# Patient Record
Sex: Male | Born: 1940 | Race: Black or African American | Hispanic: No | Marital: Married | Smoking: Former smoker
Health system: Southern US, Community
[De-identification: ages and names within clinical notes are randomized; demographics above are authoritative.]

## PROBLEM LIST (undated history)

## (undated) DIAGNOSIS — E669 Obesity, unspecified: Secondary | ICD-10-CM

## (undated) DIAGNOSIS — E119 Type 2 diabetes mellitus without complications: Secondary | ICD-10-CM

## (undated) DIAGNOSIS — I10 Essential (primary) hypertension: Secondary | ICD-10-CM

## (undated) DIAGNOSIS — T884XXA Failed or difficult intubation, initial encounter: Secondary | ICD-10-CM

## (undated) DIAGNOSIS — G4730 Sleep apnea, unspecified: Secondary | ICD-10-CM

## (undated) DIAGNOSIS — E7800 Pure hypercholesterolemia, unspecified: Secondary | ICD-10-CM

## (undated) HISTORY — PX: REPLACEMENT TOTAL KNEE BILATERAL: SUR1225

---

## 1998-11-10 ENCOUNTER — Ambulatory Visit (HOSPITAL_COMMUNITY): Admission: RE | Admit: 1998-11-10 | Discharge: 1998-11-10 | Payer: Self-pay | Admitting: Orthopedic Surgery

## 1998-11-10 ENCOUNTER — Encounter: Payer: Self-pay | Admitting: Orthopedic Surgery

## 1998-11-23 ENCOUNTER — Encounter: Payer: Self-pay | Admitting: Orthopedic Surgery

## 1998-11-24 ENCOUNTER — Ambulatory Visit (HOSPITAL_COMMUNITY): Admission: RE | Admit: 1998-11-24 | Discharge: 1998-11-24 | Payer: Self-pay | Admitting: Orthopedic Surgery

## 1999-05-15 ENCOUNTER — Encounter: Payer: Self-pay | Admitting: Cardiology

## 1999-05-15 ENCOUNTER — Ambulatory Visit (HOSPITAL_COMMUNITY): Admission: RE | Admit: 1999-05-15 | Discharge: 1999-05-15 | Payer: Self-pay | Admitting: Cardiology

## 1999-05-16 ENCOUNTER — Encounter: Payer: Self-pay | Admitting: Cardiology

## 1999-06-01 ENCOUNTER — Ambulatory Visit (HOSPITAL_COMMUNITY): Admission: RE | Admit: 1999-06-01 | Discharge: 1999-06-01 | Payer: Self-pay | Admitting: Cardiology

## 1999-06-01 ENCOUNTER — Encounter: Payer: Self-pay | Admitting: Cardiology

## 2000-06-17 ENCOUNTER — Encounter: Payer: Self-pay | Admitting: Cardiology

## 2000-06-17 ENCOUNTER — Encounter: Admission: RE | Admit: 2000-06-17 | Discharge: 2000-06-17 | Payer: Self-pay | Admitting: Cardiology

## 2000-09-04 ENCOUNTER — Ambulatory Visit: Admission: RE | Admit: 2000-09-04 | Discharge: 2000-09-04 | Payer: Self-pay | Admitting: Pulmonary Disease

## 2000-10-21 ENCOUNTER — Ambulatory Visit (HOSPITAL_COMMUNITY): Admission: RE | Admit: 2000-10-21 | Discharge: 2000-10-21 | Payer: Self-pay | Admitting: Cardiology

## 2000-10-22 ENCOUNTER — Ambulatory Visit (HOSPITAL_COMMUNITY): Admission: RE | Admit: 2000-10-22 | Discharge: 2000-10-22 | Payer: Self-pay | Admitting: Cardiology

## 2000-10-22 ENCOUNTER — Encounter: Payer: Self-pay | Admitting: Cardiology

## 2000-10-30 ENCOUNTER — Ambulatory Visit (HOSPITAL_COMMUNITY): Admission: RE | Admit: 2000-10-30 | Discharge: 2000-10-30 | Payer: Self-pay | Admitting: Cardiology

## 2000-10-31 ENCOUNTER — Encounter: Payer: Self-pay | Admitting: Cardiology

## 2001-05-12 ENCOUNTER — Ambulatory Visit (HOSPITAL_COMMUNITY): Admission: RE | Admit: 2001-05-12 | Discharge: 2001-05-12 | Payer: Self-pay | Admitting: Cardiology

## 2001-05-12 ENCOUNTER — Encounter: Payer: Self-pay | Admitting: Cardiology

## 2001-11-03 ENCOUNTER — Encounter: Admission: RE | Admit: 2001-11-03 | Discharge: 2002-02-01 | Payer: Self-pay | Admitting: *Deleted

## 2002-02-02 ENCOUNTER — Encounter: Admission: RE | Admit: 2002-02-02 | Discharge: 2002-02-10 | Payer: Self-pay | Admitting: *Deleted

## 2002-05-19 ENCOUNTER — Encounter: Admission: RE | Admit: 2002-05-19 | Discharge: 2002-05-19 | Payer: Self-pay | Admitting: Cardiology

## 2002-05-19 ENCOUNTER — Encounter: Payer: Self-pay | Admitting: Cardiology

## 2002-11-22 ENCOUNTER — Encounter: Admission: RE | Admit: 2002-11-22 | Discharge: 2003-02-20 | Payer: Self-pay | Admitting: Cardiology

## 2004-10-10 ENCOUNTER — Encounter: Admission: RE | Admit: 2004-10-10 | Discharge: 2004-10-10 | Payer: Self-pay | Admitting: Cardiology

## 2006-07-23 ENCOUNTER — Encounter: Admission: RE | Admit: 2006-07-23 | Discharge: 2006-07-23 | Payer: Self-pay | Admitting: Cardiology

## 2014-07-06 ENCOUNTER — Encounter (HOSPITAL_COMMUNITY): Payer: Self-pay | Admitting: *Deleted

## 2014-07-06 ENCOUNTER — Emergency Department (HOSPITAL_COMMUNITY)
Admission: EM | Admit: 2014-07-06 | Discharge: 2014-07-06 | Disposition: A | Discharge status: Home / Self Care | Payer: 59 | Attending: Emergency Medicine | Admitting: Emergency Medicine

## 2014-07-06 DIAGNOSIS — I10 Essential (primary) hypertension: Secondary | ICD-10-CM | POA: Diagnosis not present

## 2014-07-06 DIAGNOSIS — E669 Obesity, unspecified: Secondary | ICD-10-CM | POA: Diagnosis not present

## 2014-07-06 DIAGNOSIS — M545 Low back pain, unspecified: Secondary | ICD-10-CM

## 2014-07-06 DIAGNOSIS — Z87891 Personal history of nicotine dependence: Secondary | ICD-10-CM | POA: Diagnosis not present

## 2014-07-06 DIAGNOSIS — E119 Type 2 diabetes mellitus without complications: Secondary | ICD-10-CM | POA: Insufficient documentation

## 2014-07-06 DIAGNOSIS — M25551 Pain in right hip: Secondary | ICD-10-CM | POA: Diagnosis present

## 2014-07-06 HISTORY — DX: Pure hypercholesterolemia, unspecified: E78.00

## 2014-07-06 HISTORY — DX: Obesity, unspecified: E66.9

## 2014-07-06 HISTORY — DX: Essential (primary) hypertension: I10

## 2014-07-06 HISTORY — DX: Type 2 diabetes mellitus without complications: E11.9

## 2014-07-06 MED ORDER — HYDROCODONE-ACETAMINOPHEN 5-325 MG PO TABS: 1.0000 | ORAL_TABLET | Freq: Four times a day (QID) | ORAL | Status: DC | PRN

## 2014-07-06 MED ORDER — HYDROCODONE-ACETAMINOPHEN 5-325 MG PO TABS
1.0000 | ORAL_TABLET | Freq: Once | ORAL | Status: AC
  Administered 2014-07-06: 1 via ORAL
  Filled 2014-07-06: qty 1

## 2014-07-06 MED ORDER — HYDROCODONE-ACETAMINOPHEN 5-325 MG PO TABS: 1.0000 | ORAL_TABLET | ORAL | Status: DC | PRN

## 2014-07-06 NOTE — ED Provider Notes (Signed)
CSN: [id number]     Arrival date & time 07/06/14  [id number] History   First MD Initiated Contact with Patient 07/06/14 [phone]     Chief Complaint  Patient presents with  . Hip Pain     (Consider location/radiation/quality/duration/timing/severity/associated sxs/prior Treatment) HPI Complains of right hip pain (points to right sacroiliac joint), gradual onset 2 nights ago after riding on a lawnmower. No trauma pain worse with movement improved with remaining still he took a pain pill given to him by his wife(he does not recall what the pill was), with some improvement of discomfort. Pain is nonradiating, moderate to severe. No trauma no fever no other associated symptoms Past Medical History  Diagnosis Date  . Hypertension   . High cholesterol   . Diabetes mellitus without complication   . Obesity    History reviewed. No pertinent past surgical history. bilateral knee surgeries History reviewed. No pertinent family history. History  Substance Use Topics  . Smoking status: Former Games developermoker  . Smokeless tobacco: Not on file  . Alcohol Use: Yes     Comment: occ    Review of Systems  Musculoskeletal: Positive for arthralgias.  All other systems reviewed and are negative.     Allergies  Review of patient's allergies indicates no known allergies.  Home Medications   Prior to Admission medications   Not on File   BP 126/60 mmHg  Pulse 82  Temp(Src) 99.1 F (37.3 C) (Oral)  Resp 20  Ht 5\' 11"  (1.803 m)  Wt 278 lb (126.1 kg)  BMI 38.79 kg/m2  SpO2 97% Physical Exam  Constitutional: He appears well-developed and well-nourished. He appears distressed.  Appears mildly uncomfortable, nontoxic alert  HENT:  Head: Normocephalic and atraumatic.  Eyes: Conjunctivae are normal. Pupils are equal, round, and reactive to light.  Neck: Neck supple. No tracheal deviation present. No thyromegaly present.  Cardiovascular: Normal rate and regular rhythm.   No murmur heard. Pulmonary/Chest:  Effort normal and breath sounds normal.  Abdominal: Soft. Bowel sounds are normal. He exhibits no distension. There is no tenderness.  Morbidly obese  Musculoskeletal: Normal range of motion. He exhibits no edema or tenderness.  Entire spine nontender pelvis stable nontender. Has pain at right sacroiliac joint when stands up to walk. Walks with minimal limp favoring right leg  Neurological: He is alert. Coordination normal.  Skin: Skin is warm and dry. No rash noted.  Psychiatric: He has a normal mood and affect.  Nursing note and vitals reviewed.   ED Course  Procedures (including critical care time) Labs Review Labs Reviewed - No data to display  Imaging Review No results found.   EKG Interpretation None      MDM  I suspect the patient has muscle or ligamentous strain. Sacroiliitis also a possibility There was no trauma. He does not feel ill. Imaging not indicated acutely. Patient agrees. Plan prescription Norco.. Tylenol for mild pain We will not give as pt patient diabetic Follow up with PMD, urgent care or return if not better in 3 or 4 days Diagnosis: Low back pain Final diagnoses:  None        Doug SouSam Fue Cervenka, MD 07/06/14 [phone]

## 2014-07-06 NOTE — Discharge Instructions (Signed)
Take Tylenol for mild pain or the pain medicine prescribed for bad pain. Don't drink any alcohol with the medicine prescribed as the combination is dangerous. Go to an urgent care center, [facility] doctor at the [facility], or turn if not better in 3 or 4 days.

## 2014-07-06 NOTE — ED Notes (Signed)
Pt reports right hip pain since mowing the grass a few days ago.

## 2016-05-24 ENCOUNTER — Emergency Department (HOSPITAL_COMMUNITY)
Admission: EM | Admit: 2016-05-24 | Discharge: 2016-05-24 | Disposition: A | Discharge status: Home / Self Care | Payer: No Typology Code available for payment source | Attending: Emergency Medicine | Admitting: Emergency Medicine

## 2016-05-24 ENCOUNTER — Encounter (HOSPITAL_COMMUNITY): Payer: Self-pay

## 2016-05-24 DIAGNOSIS — Y939 Activity, unspecified: Secondary | ICD-10-CM | POA: Insufficient documentation

## 2016-05-24 DIAGNOSIS — Y92410 Unspecified street and highway as the place of occurrence of the external cause: Secondary | ICD-10-CM | POA: Insufficient documentation

## 2016-05-24 DIAGNOSIS — I10 Essential (primary) hypertension: Secondary | ICD-10-CM | POA: Diagnosis not present

## 2016-05-24 DIAGNOSIS — Y999 Unspecified external cause status: Secondary | ICD-10-CM | POA: Insufficient documentation

## 2016-05-24 DIAGNOSIS — S4992XA Unspecified injury of left shoulder and upper arm, initial encounter: Secondary | ICD-10-CM | POA: Diagnosis present

## 2016-05-24 DIAGNOSIS — Z043 Encounter for examination and observation following other accident: Secondary | ICD-10-CM

## 2016-05-24 DIAGNOSIS — E119 Type 2 diabetes mellitus without complications: Secondary | ICD-10-CM | POA: Diagnosis not present

## 2016-05-24 DIAGNOSIS — Z87891 Personal history of nicotine dependence: Secondary | ICD-10-CM | POA: Diagnosis not present

## 2016-05-24 DIAGNOSIS — Z041 Encounter for examination and observation following transport accident: Secondary | ICD-10-CM

## 2016-05-24 DIAGNOSIS — S46912A Strain of unspecified muscle, fascia and tendon at shoulder and upper arm level, left arm, initial encounter: Secondary | ICD-10-CM | POA: Insufficient documentation

## 2016-05-24 MED ORDER — LIDOCAINE 5 % EX PTCH: 1.0000 | MEDICATED_PATCH | CUTANEOUS | 0 refills | Status: DC

## 2016-05-24 NOTE — Discharge Instructions (Signed)
Contact a health care provider if: °You have increasing pain or swelling in the injured area. °You have numbness, tingling, or a significant loss of strength in the injured area °
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
## 2016-05-24 NOTE — ED Notes (Signed)
Pt comfortable with discharge and follow up instructions. Pt declines wheelchair, escorted to waiting area by this RN. Rx x1 
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
## 2018-06-29 ENCOUNTER — Other Ambulatory Visit: Payer: Self-pay

## 2018-06-29 ENCOUNTER — Emergency Department (HOSPITAL_COMMUNITY)
Admission: EM | Admit: 2018-06-29 | Discharge: 2018-06-30 | Disposition: A | Discharge status: Home / Self Care | Payer: Medicare Other | Source: Home / Self Care | Attending: Emergency Medicine | Admitting: Emergency Medicine

## 2018-06-29 DIAGNOSIS — R7881 Bacteremia: Secondary | ICD-10-CM | POA: Diagnosis not present

## 2018-06-29 DIAGNOSIS — Z87891 Personal history of nicotine dependence: Secondary | ICD-10-CM | POA: Insufficient documentation

## 2018-06-29 DIAGNOSIS — I129 Hypertensive chronic kidney disease with stage 1 through stage 4 chronic kidney disease, or unspecified chronic kidney disease: Secondary | ICD-10-CM | POA: Diagnosis present

## 2018-06-29 DIAGNOSIS — J101 Influenza due to other identified influenza virus with other respiratory manifestations: Secondary | ICD-10-CM

## 2018-06-29 DIAGNOSIS — N183 Chronic kidney disease, stage 3 (moderate): Secondary | ICD-10-CM | POA: Diagnosis present

## 2018-06-29 DIAGNOSIS — J111 Influenza due to unidentified influenza virus with other respiratory manifestations: Secondary | ICD-10-CM

## 2018-06-29 DIAGNOSIS — Z6841 Body Mass Index (BMI) 40.0 and over, adult: Secondary | ICD-10-CM

## 2018-06-29 DIAGNOSIS — E7800 Pure hypercholesterolemia, unspecified: Secondary | ICD-10-CM | POA: Diagnosis present

## 2018-06-29 DIAGNOSIS — Z79899 Other long term (current) drug therapy: Secondary | ICD-10-CM

## 2018-06-29 DIAGNOSIS — R69 Illness, unspecified: Principal | ICD-10-CM

## 2018-06-29 DIAGNOSIS — R109 Unspecified abdominal pain: Secondary | ICD-10-CM | POA: Insufficient documentation

## 2018-06-29 DIAGNOSIS — E119 Type 2 diabetes mellitus without complications: Secondary | ICD-10-CM

## 2018-06-29 DIAGNOSIS — Z79891 Long term (current) use of opiate analgesic: Secondary | ICD-10-CM

## 2018-06-29 DIAGNOSIS — E860 Dehydration: Secondary | ICD-10-CM | POA: Diagnosis present

## 2018-06-29 DIAGNOSIS — J209 Acute bronchitis, unspecified: Secondary | ICD-10-CM | POA: Diagnosis present

## 2018-06-29 DIAGNOSIS — A409 Streptococcal sepsis, unspecified: Principal | ICD-10-CM | POA: Diagnosis present

## 2018-06-29 DIAGNOSIS — Z96653 Presence of artificial knee joint, bilateral: Secondary | ICD-10-CM | POA: Diagnosis present

## 2018-06-29 DIAGNOSIS — E785 Hyperlipidemia, unspecified: Secondary | ICD-10-CM | POA: Diagnosis present

## 2018-06-29 DIAGNOSIS — I10 Essential (primary) hypertension: Secondary | ICD-10-CM | POA: Insufficient documentation

## 2018-06-29 DIAGNOSIS — D631 Anemia in chronic kidney disease: Secondary | ICD-10-CM | POA: Diagnosis present

## 2018-06-29 DIAGNOSIS — E1122 Type 2 diabetes mellitus with diabetic chronic kidney disease: Secondary | ICD-10-CM | POA: Diagnosis present

## 2018-06-29 DIAGNOSIS — N179 Acute kidney failure, unspecified: Secondary | ICD-10-CM | POA: Diagnosis present

## 2018-06-29 DIAGNOSIS — E669 Obesity, unspecified: Secondary | ICD-10-CM | POA: Diagnosis present

## 2018-06-30 ENCOUNTER — Emergency Department (HOSPITAL_COMMUNITY): Payer: Medicare Other

## 2018-06-30 ENCOUNTER — Encounter (HOSPITAL_COMMUNITY): Payer: Self-pay | Admitting: *Deleted

## 2018-06-30 ENCOUNTER — Other Ambulatory Visit: Payer: Self-pay

## 2018-06-30 ENCOUNTER — Encounter (HOSPITAL_COMMUNITY): Payer: Self-pay | Admitting: Emergency Medicine

## 2018-06-30 ENCOUNTER — Inpatient Hospital Stay (HOSPITAL_COMMUNITY)
Admission: EM | Admit: 2018-06-30 | Discharge: 2018-07-02 | DRG: 872 | Disposition: A | Discharge status: Home / Self Care | Payer: Medicare Other | Attending: Internal Medicine | Admitting: Internal Medicine

## 2018-06-30 DIAGNOSIS — E7800 Pure hypercholesterolemia, unspecified: Secondary | ICD-10-CM | POA: Diagnosis present

## 2018-06-30 DIAGNOSIS — N183 Chronic kidney disease, stage 3 unspecified: Secondary | ICD-10-CM | POA: Diagnosis present

## 2018-06-30 DIAGNOSIS — I10 Essential (primary) hypertension: Secondary | ICD-10-CM | POA: Diagnosis present

## 2018-06-30 DIAGNOSIS — Z79891 Long term (current) use of opiate analgesic: Secondary | ICD-10-CM | POA: Diagnosis not present

## 2018-06-30 DIAGNOSIS — E1129 Type 2 diabetes mellitus with other diabetic kidney complication: Secondary | ICD-10-CM | POA: Diagnosis present

## 2018-06-30 DIAGNOSIS — B955 Unspecified streptococcus as the cause of diseases classified elsewhere: Secondary | ICD-10-CM | POA: Diagnosis not present

## 2018-06-30 DIAGNOSIS — N179 Acute kidney failure, unspecified: Secondary | ICD-10-CM | POA: Diagnosis present

## 2018-06-30 DIAGNOSIS — R7881 Bacteremia: Secondary | ICD-10-CM | POA: Diagnosis present

## 2018-06-30 DIAGNOSIS — Z6841 Body Mass Index (BMI) 40.0 and over, adult: Secondary | ICD-10-CM | POA: Diagnosis not present

## 2018-06-30 DIAGNOSIS — E1122 Type 2 diabetes mellitus with diabetic chronic kidney disease: Secondary | ICD-10-CM

## 2018-06-30 DIAGNOSIS — Z79899 Other long term (current) drug therapy: Secondary | ICD-10-CM | POA: Diagnosis not present

## 2018-06-30 DIAGNOSIS — Z87891 Personal history of nicotine dependence: Secondary | ICD-10-CM | POA: Diagnosis not present

## 2018-06-30 DIAGNOSIS — J209 Acute bronchitis, unspecified: Secondary | ICD-10-CM | POA: Diagnosis present

## 2018-06-30 DIAGNOSIS — E669 Obesity, unspecified: Secondary | ICD-10-CM | POA: Diagnosis present

## 2018-06-30 DIAGNOSIS — I129 Hypertensive chronic kidney disease with stage 1 through stage 4 chronic kidney disease, or unspecified chronic kidney disease: Secondary | ICD-10-CM | POA: Diagnosis present

## 2018-06-30 DIAGNOSIS — A409 Streptococcal sepsis, unspecified: Secondary | ICD-10-CM | POA: Diagnosis present

## 2018-06-30 DIAGNOSIS — A419 Sepsis, unspecified organism: Secondary | ICD-10-CM | POA: Diagnosis present

## 2018-06-30 DIAGNOSIS — D631 Anemia in chronic kidney disease: Secondary | ICD-10-CM | POA: Diagnosis present

## 2018-06-30 DIAGNOSIS — N189 Chronic kidney disease, unspecified: Secondary | ICD-10-CM | POA: Diagnosis present

## 2018-06-30 DIAGNOSIS — E119 Type 2 diabetes mellitus without complications: Secondary | ICD-10-CM | POA: Diagnosis not present

## 2018-06-30 DIAGNOSIS — Z96653 Presence of artificial knee joint, bilateral: Secondary | ICD-10-CM | POA: Diagnosis present

## 2018-06-30 DIAGNOSIS — E785 Hyperlipidemia, unspecified: Secondary | ICD-10-CM | POA: Diagnosis present

## 2018-06-30 DIAGNOSIS — E860 Dehydration: Secondary | ICD-10-CM | POA: Diagnosis present

## 2018-06-30 LAB — RESPIRATORY PANEL BY PCR
Adenovirus: NOT DETECTED
Bordetella pertussis: NOT DETECTED
CHLAMYDOPHILA PNEUMONIAE-RVPPCR: NOT DETECTED
Coronavirus 229E: NOT DETECTED
Coronavirus HKU1: NOT DETECTED
Coronavirus NL63: NOT DETECTED
Coronavirus OC43: NOT DETECTED
INFLUENZA A-RVPPCR: NOT DETECTED
Influenza B: NOT DETECTED
Metapneumovirus: NOT DETECTED
Mycoplasma pneumoniae: NOT DETECTED
Parainfluenza Virus 1: NOT DETECTED
Parainfluenza Virus 2: NOT DETECTED
Parainfluenza Virus 3: NOT DETECTED
Parainfluenza Virus 4: NOT DETECTED
Respiratory Syncytial Virus: NOT DETECTED
Rhinovirus / Enterovirus: NOT DETECTED

## 2018-06-30 LAB — CBC WITH DIFFERENTIAL/PLATELET
Abs Immature Granulocytes: 0.12 10*3/uL — ABNORMAL HIGH (ref 0.00–0.07)
Abs Immature Granulocytes: 0.46 10*3/uL — ABNORMAL HIGH (ref 0.00–0.07)
Basophils Absolute: 0.1 10*3/uL (ref 0.0–0.1)
Basophils Absolute: 0.1 10*3/uL (ref 0.0–0.1)
Basophils Relative: 0 %
Basophils Relative: 0 %
Eosinophils Absolute: 0 10*3/uL (ref 0.0–0.5)
Eosinophils Absolute: 0 10*3/uL (ref 0.0–0.5)
Eosinophils Relative: 0 %
Eosinophils Relative: 0 %
HCT: 37.1 % — ABNORMAL LOW (ref 39.0–52.0)
HEMATOCRIT: 33.6 % — AB (ref 39.0–52.0)
Hemoglobin: 11.4 g/dL — ABNORMAL LOW (ref 13.0–17.0)
Hemoglobin: 12.1 g/dL — ABNORMAL LOW (ref 13.0–17.0)
Immature Granulocytes: 1 %
Immature Granulocytes: 2 %
Lymphocytes Relative: 12 %
Lymphocytes Relative: 14 %
Lymphs Abs: 1.9 10*3/uL (ref 0.7–4.0)
Lymphs Abs: 2.9 10*3/uL (ref 0.7–4.0)
MCH: 27.6 pg (ref 26.0–34.0)
MCH: 28.9 pg (ref 26.0–34.0)
MCHC: 32.6 g/dL (ref 30.0–36.0)
MCHC: 33.9 g/dL (ref 30.0–36.0)
MCV: 84.7 fL (ref 80.0–100.0)
MCV: 85.1 fL (ref 80.0–100.0)
MONO ABS: 2.6 10*3/uL — AB (ref 0.1–1.0)
Monocytes Absolute: 1.1 10*3/uL — ABNORMAL HIGH (ref 0.1–1.0)
Monocytes Relative: 11 %
Monocytes Relative: 7 %
Neutro Abs: 11 10*3/uL — ABNORMAL HIGH (ref 1.7–7.7)
Neutro Abs: 17.1 10*3/uL — ABNORMAL HIGH (ref 1.7–7.7)
Neutrophils Relative %: 75 %
Neutrophils Relative %: 78 %
Platelets: 214 10*3/uL (ref 150–400)
Platelets: 217 10*3/uL (ref 150–400)
RBC: 3.95 MIL/uL — ABNORMAL LOW (ref 4.22–5.81)
RBC: 4.38 MIL/uL (ref 4.22–5.81)
RDW: 13.3 % (ref 11.5–15.5)
RDW: 13.7 % (ref 11.5–15.5)
WBC: 14.1 10*3/uL — AB (ref 4.0–10.5)
WBC: 23.1 10*3/uL — ABNORMAL HIGH (ref 4.0–10.5)
nRBC: 0 % (ref 0.0–0.2)
nRBC: 0 % (ref 0.0–0.2)

## 2018-06-30 LAB — BLOOD CULTURE ID PANEL (REFLEXED)
Acinetobacter baumannii: NOT DETECTED
Candida albicans: NOT DETECTED
Candida glabrata: NOT DETECTED
Candida krusei: NOT DETECTED
Candida parapsilosis: NOT DETECTED
Candida tropicalis: NOT DETECTED
ENTEROBACTERIACEAE SPECIES: NOT DETECTED
Enterobacter cloacae complex: NOT DETECTED
Enterococcus species: NOT DETECTED
Escherichia coli: NOT DETECTED
HAEMOPHILUS INFLUENZAE: NOT DETECTED
Klebsiella oxytoca: NOT DETECTED
Klebsiella pneumoniae: NOT DETECTED
Listeria monocytogenes: NOT DETECTED
Neisseria meningitidis: NOT DETECTED
PSEUDOMONAS AERUGINOSA: NOT DETECTED
Proteus species: NOT DETECTED
STREPTOCOCCUS AGALACTIAE: NOT DETECTED
STREPTOCOCCUS SPECIES: DETECTED — AB
Serratia marcescens: NOT DETECTED
Staphylococcus aureus (BCID): NOT DETECTED
Staphylococcus species: NOT DETECTED
Streptococcus pneumoniae: NOT DETECTED
Streptococcus pyogenes: DETECTED — AB

## 2018-06-30 LAB — COMPREHENSIVE METABOLIC PANEL
ALT: 42 U/L (ref 0–44)
ALT: 33 U/L (ref 0–44)
ANION GAP: 10 (ref 5–15)
AST: 42 U/L — ABNORMAL HIGH (ref 15–41)
AST: 31 U/L (ref 15–41)
Albumin: 3.5 g/dL (ref 3.5–5.0)
Albumin: 2.9 g/dL — ABNORMAL LOW (ref 3.5–5.0)
Alkaline Phosphatase: 48 U/L (ref 38–126)
Alkaline Phosphatase: 46 U/L (ref 38–126)
Anion gap: 10 (ref 5–15)
BUN: 16 mg/dL (ref 8–23)
BUN: 15 mg/dL (ref 8–23)
CALCIUM: 8.4 mg/dL — AB (ref 8.9–10.3)
CO2: 24 mmol/L (ref 22–32)
CO2: 22 mmol/L (ref 22–32)
Calcium: 9.2 mg/dL (ref 8.9–10.3)
Chloride: 98 mmol/L (ref 98–111)
Chloride: 100 mmol/L (ref 98–111)
Creatinine, Ser: 1.69 mg/dL — ABNORMAL HIGH (ref 0.61–1.24)
Creatinine, Ser: 1.85 mg/dL — ABNORMAL HIGH (ref 0.61–1.24)
GFR calc Af Amer: 45 mL/min — ABNORMAL LOW (ref >60)
GFR calc Af Amer: 40 mL/min — ABNORMAL LOW (ref >60)
GFR calc non Af Amer: 39 mL/min — ABNORMAL LOW (ref >60)
GFR calc non Af Amer: 35 mL/min — ABNORMAL LOW (ref >60)
GLUCOSE: 177 mg/dL — AB (ref 70–99)
Glucose, Bld: 154 mg/dL — ABNORMAL HIGH (ref 70–99)
Potassium: 3.7 mmol/L (ref 3.5–5.1)
Potassium: 4 mmol/L (ref 3.5–5.1)
SODIUM: 132 mmol/L — AB (ref 135–145)
Sodium: 132 mmol/L — ABNORMAL LOW (ref 135–145)
TOTAL PROTEIN: 7.5 g/dL (ref 6.5–8.1)
Total Bilirubin: 1 mg/dL (ref 0.3–1.2)
Total Bilirubin: 1.2 mg/dL (ref 0.3–1.2)
Total Protein: 6.8 g/dL (ref 6.5–8.1)

## 2018-06-30 LAB — URINALYSIS, ROUTINE W REFLEX MICROSCOPIC
Bilirubin Urine: NEGATIVE
GLUCOSE, UA: NEGATIVE mg/dL
Hgb urine dipstick: NEGATIVE
Ketones, ur: NEGATIVE mg/dL
Leukocytes,Ua: NEGATIVE
Nitrite: NEGATIVE
Protein, ur: 100 mg/dL — AB
Specific Gravity, Urine: 1.021 (ref 1.005–1.030)
pH: 5 (ref 5.0–8.0)

## 2018-06-30 LAB — PROCALCITONIN: Procalcitonin: 1.3 ng/mL

## 2018-06-30 LAB — INFLUENZA PANEL BY PCR (TYPE A & B)
Influenza A By PCR: NEGATIVE
Influenza B By PCR: NEGATIVE

## 2018-06-30 LAB — PROTIME-INR
INR: 1.3 — ABNORMAL HIGH (ref 0.8–1.2)
Prothrombin Time: 15.8 seconds — ABNORMAL HIGH (ref 11.4–15.2)

## 2018-06-30 LAB — LACTIC ACID, PLASMA
Lactic Acid, Venous: 1.4 mmol/L (ref 0.5–1.9)
Lactic Acid, Venous: 1.7 mmol/L (ref 0.5–1.9)
Lactic Acid, Venous: 1.7 mmol/L (ref 0.5–1.9)
Lactic Acid, Venous: 1.3 mmol/L (ref 0.5–1.9)

## 2018-06-30 LAB — CBG MONITORING, ED: Glucose-Capillary: 157 mg/dL — ABNORMAL HIGH (ref 70–99)

## 2018-06-30 MED ORDER — SODIUM CHLORIDE 0.9 % IV BOLUS
1000.0000 mL | Freq: Once | INTRAVENOUS | Status: AC
  Administered 2018-06-30: 1000 mL via INTRAVENOUS

## 2018-06-30 MED ORDER — ENOXAPARIN SODIUM 40 MG/0.4ML ~~LOC~~ SOLN
40.0000 mg | Freq: Every day | SUBCUTANEOUS | Status: DC
  Administered 2018-07-01 – 2018-07-02 (×2): 40 mg via SUBCUTANEOUS
  Filled 2018-06-30 (×2): qty 0.4

## 2018-06-30 MED ORDER — ONDANSETRON HCL 4 MG/2ML IJ SOLN: 4.0000 mg | Freq: Three times a day (TID) | INTRAMUSCULAR | Status: DC | PRN

## 2018-06-30 MED ORDER — SODIUM CHLORIDE 0.9 % IV BOLUS
500.0000 mL | Freq: Once | INTRAVENOUS | Status: AC
  Administered 2018-06-30: 500 mL via INTRAVENOUS

## 2018-06-30 MED ORDER — BRINZOLAMIDE 1 % OP SUSP
1.0000 [drp] | Freq: Three times a day (TID) | OPHTHALMIC | Status: DC
  Administered 2018-07-01 – 2018-07-02 (×4): 1 [drp] via OPHTHALMIC
  Filled 2018-06-30: qty 10

## 2018-06-30 MED ORDER — PRAZOSIN HCL 2 MG PO CAPS: 2.0000 mg | ORAL_CAPSULE | Freq: Every evening | ORAL | Status: DC | PRN

## 2018-06-30 MED ORDER — BUPROPION HCL ER (SR) 100 MG PO TB12
100.0000 mg | ORAL_TABLET | Freq: Two times a day (BID) | ORAL | Status: DC
  Administered 2018-07-01 – 2018-07-02 (×4): 100 mg via ORAL
  Filled 2018-06-30 (×6): qty 1

## 2018-06-30 MED ORDER — SODIUM CHLORIDE 0.9 % IV SOLN
2.0000 g | Freq: Once | INTRAVENOUS | Status: AC
  Administered 2018-06-30: 2 g via INTRAVENOUS
  Filled 2018-06-30: qty 2

## 2018-06-30 MED ORDER — ZOLPIDEM TARTRATE 5 MG PO TABS: 5.0000 mg | ORAL_TABLET | Freq: Every evening | ORAL | Status: DC | PRN

## 2018-06-30 MED ORDER — METRONIDAZOLE IN NACL 5-0.79 MG/ML-% IV SOLN
500.0000 mg | Freq: Once | INTRAVENOUS | Status: AC
  Administered 2018-06-30: 500 mg via INTRAVENOUS
  Filled 2018-06-30: qty 100

## 2018-06-30 MED ORDER — TIMOLOL MALEATE 0.5 % OP SOLN
1.0000 [drp] | Freq: Every morning | OPHTHALMIC | Status: DC
  Administered 2018-07-01 – 2018-07-02 (×2): 1 [drp] via OPHTHALMIC
  Filled 2018-06-30: qty 5

## 2018-06-30 MED ORDER — SODIUM CHLORIDE 0.9% FLUSH
3.0000 mL | Freq: Once | INTRAVENOUS | Status: AC
  Administered 2018-06-30: 3 mL via INTRAVENOUS

## 2018-06-30 MED ORDER — BRIMONIDINE TARTRATE 0.2 % OP SOLN
1.0000 [drp] | Freq: Three times a day (TID) | OPHTHALMIC | Status: DC
  Administered 2018-07-01 – 2018-07-02 (×4): 1 [drp] via OPHTHALMIC
  Filled 2018-06-30: qty 5

## 2018-06-30 MED ORDER — ACETAMINOPHEN 325 MG PO TABS: 650.0000 mg | ORAL_TABLET | Freq: Four times a day (QID) | ORAL | Status: DC | PRN

## 2018-06-30 MED ORDER — LATANOPROST 0.005 % OP SOLN
1.0000 [drp] | Freq: Every day | OPHTHALMIC | Status: DC
  Administered 2018-06-30 – 2018-07-01 (×2): 1 [drp] via OPHTHALMIC
  Filled 2018-06-30: qty 2.5

## 2018-06-30 MED ORDER — INSULIN ASPART 100 UNIT/ML ~~LOC~~ SOLN
0.0000 [IU] | Freq: Three times a day (TID) | SUBCUTANEOUS | Status: DC
  Administered 2018-07-01 (×3): 1 [IU] via SUBCUTANEOUS

## 2018-06-30 MED ORDER — VANCOMYCIN HCL 10 G IV SOLR
2000.0000 mg | Freq: Once | INTRAVENOUS | Status: DC
  Filled 2018-06-30: qty 2000

## 2018-06-30 MED ORDER — INSULIN ASPART 100 UNIT/ML ~~LOC~~ SOLN: 0.0000 [IU] | Freq: Every day | SUBCUTANEOUS | Status: DC

## 2018-06-30 MED ORDER — HYDRALAZINE HCL 20 MG/ML IJ SOLN: 5.0000 mg | INTRAMUSCULAR | Status: DC | PRN

## 2018-06-30 MED ORDER — SODIUM CHLORIDE 0.9 % IV SOLN
2.0000 g | INTRAVENOUS | Status: DC
  Administered 2018-07-01 – 2018-07-02 (×2): 2 g via INTRAVENOUS
  Filled 2018-06-30 (×3): qty 20

## 2018-06-30 MED ORDER — SODIUM CHLORIDE 0.9 % IV BOLUS
2000.0000 mL | Freq: Once | INTRAVENOUS | Status: AC
  Administered 2018-06-30: 2000 mL via INTRAVENOUS

## 2018-06-30 MED ORDER — ACETAMINOPHEN 500 MG PO TABS
1000.0000 mg | ORAL_TABLET | Freq: Once | ORAL | Status: AC
  Administered 2018-06-30: 1000 mg via ORAL
  Filled 2018-06-30: qty 2

## 2018-06-30 MED ORDER — VANCOMYCIN HCL IN DEXTROSE 1-5 GM/200ML-% IV SOLN: 1000.0000 mg | Freq: Once | INTRAVENOUS | Status: DC

## 2018-06-30 MED ORDER — VANCOMYCIN HCL IN DEXTROSE 1-5 GM/200ML-% IV SOLN
1000.0000 mg | Freq: Once | INTRAVENOUS | Status: AC
  Administered 2018-06-30: 1000 mg via INTRAVENOUS
  Filled 2018-06-30: qty 200

## 2018-06-30 MED ORDER — SODIUM CHLORIDE 0.9 % IV SOLN
INTRAVENOUS | Status: DC
  Administered 2018-06-30: 22:00:00 via INTRAVENOUS

## 2018-06-30 MED ORDER — DM-GUAIFENESIN ER 30-600 MG PO TB12: 1.0000 | ORAL_TABLET | Freq: Two times a day (BID) | ORAL | Status: DC | PRN

## 2018-06-30 NOTE — H&P (Signed)
History and Physical    Larry Liu MLY:[id number] DOB: 09-Jun-1940 DOA: 06/30/2018  Referring MD/NP/PA:   PCP: System, Pcp Not In   Patient coming from:  The patient is coming from home.  At baseline, pt is independent for most of ADL.        Chief Complaint: Fever, chills, cough, shortness breath, bacteremia  HPI: Larry Liu is a 78 y.o. male with medical history significant of hypertension, hyperlipidemia, diabetes mellitus, obesity, CKD 3, who presents with fever, chills, cough, shortness of breath and bacteremia.  Patient states that he has been having fever, chills, cough, shortness of breath, generalized weakness for more than 2 weeks.  No chest pain.  His cough is nonproductive.  Patient denies nausea, vomiting, diarrhea, abdominal pain, symptoms of UTI or unilateral weakness. Pt was evaluated in ED yesterday for the same. Noted to be febrile and tachycardic.  Had elevated white blood cell count.  Normal lactic acid.  Blood cultures were sent out. COVID19 test was also sent out. He denies any recent travel.  Denies any known exposures to COVID-19. Patient was given IV antibiotics at that time.  He declined admission. Blood cultures today grew up Streptococcus pyogenes. Patient was called back into the emergency department.   ED Course: pt was found to have WBC 23.1, lactic acid 1.1, INR 1.3, negative urinalysis, negative flu PCR, worsening renal function, temperature 101.8, tachycardia, tachypnea, oxygen saturation 95-100% on room air.  Chest x-ray showed mild bronchitic change.  CT of renal stone study is negative.  Patient is admitted to telemetry bed as inpatient.  Review of Systems:   General: has fevers, chills, no body weight gain, has poor appetite, has fatigue HEENT: no blurry vision, hearing changes or sore throat Respiratory: has dyspnea, coughing, no wheezing CV: no chest pain, no palpitations GI: no nausea, vomiting, abdominal pain, diarrhea, constipation GU: no  dysuria, burning on urination, increased urinary frequency, hematuria  Ext: no leg edema Neuro: no unilateral weakness, numbness, or tingling, no vision change or hearing loss Skin: no rash, no skin tear. MSK: No muscle spasm, no deformity, no limitation of range of movement in spin Heme: No easy bruising.  Travel history: No recent long distant travel.  Allergy: No Known Allergies  Past Medical History:  Diagnosis Date  . Diabetes mellitus without complication (HCC)   . High cholesterol   . Hypertension   . Obesity     Past Surgical History:  Procedure Laterality Date  . REPLACEMENT TOTAL KNEE BILATERAL      Social History:  reports that he has quit smoking. He has never used smokeless tobacco. He reports current alcohol use. He reports that he does not use drugs.  Family History:  Family History  Problem Relation Age of Onset  . Stroke Mother   . Stroke Father      Prior to Admission medications   Medication Sig Start Date End Date Taking? Authorizing Provider  HYDROcodone-acetaminophen (NORCO) 5-325 MG per tablet Take 1-2 tablets by mouth every 6 (six) hours as needed for moderate pain or severe pain. 07/06/14   Doug Sou, MD  lidocaine (LIDODERM) 5 % Place 1 patch onto the skin daily. Remove & Discard patch within 12 hours or as directed by MD 05/24/16   Arthor Captain, PA-C    Physical Exam: Vitals:   06/30/18 2100 06/30/18 2115 06/30/18 2204 06/30/18 2205  BP: (!) 142/58 (!) 129/96 116/60   Pulse: 93 93  92  Resp: 16 (!) 21  17 19  Temp:      TempSrc:      SpO2: 100% 98%  97%  Weight:      Height:       General: Not in acute distress HEENT:       Eyes: PERRL, EOMI, no scleral icterus.       ENT: No discharge from the ears and nose, no pharynx injection, no tonsillar enlargement.        Neck: No JVD, no bruit, no mass felt. Heme: No neck lymph node enlargement. Cardiac: S1/S2, RRR, No murmurs, No gallops or rubs. Respiratory: Has coarse breathing sound  bilaterally. no rales, wheezing, rhonchi or rubs. GI: Soft, nondistended, nontender, no rebound pain, no organomegaly, BS present. GU: No hematuria Ext: No pitting leg edema bilaterally. 2+DP/PT pulse bilaterally. Musculoskeletal: No joint deformities, No joint redness or warmth, no limitation of ROM in spin. Skin: No rashes.  Neuro: Alert, oriented X3, cranial nerves II-XII grossly intact, moves all extremities normally.  Psych: Patient is not psychotic, no suicidal or hemocidal ideation.  Labs on Admission: I have personally reviewed following labs and imaging studies  CBC: Recent Labs  Lab 06/30/18 0033 06/30/18 1715  WBC 14.1* 23.1*  NEUTROABS 11.0* 17.1*  HGB 12.1* 11.4*  HCT 37.1* 33.6*  MCV 84.7 85.1  PLT 217 214   Basic Metabolic Panel: Recent Labs  Lab 06/30/18 0033 06/30/18 1843  NA 132* 132*  K 3.7 4.0  CL 98 100  CO2 24 22  GLUCOSE 177* 154*  BUN 16 15  CREATININE 1.69* 1.85*  CALCIUM 9.2 8.4*   GFR: Estimated Creatinine Clearance: 49.4 mL/min (A) (by C-G formula based on SCr of 1.85 mg/dL (H)). Liver Function Tests: Recent Labs  Lab 06/30/18 0033 06/30/18 1843  AST 42* 31  ALT 42 33  ALKPHOS 48 46  BILITOT 1.0 1.2  PROT 7.5 6.8  ALBUMIN 3.5 2.9*   No results for input(s): LIPASE, AMYLASE in the last 168 hours. No results for input(s): AMMONIA in the last 168 hours. Coagulation Profile: Recent Labs  Lab 06/30/18 0033  INR 1.3*   Cardiac Enzymes: No results for input(s): CKTOTAL, CKMB, CKMBINDEX, TROPONINI in the last 168 hours. BNP (last 3 results) No results for input(s): PROBNP in the last 8760 hours. HbA1C: No results for input(s): HGBA1C in the last 72 hours. CBG: Recent Labs  Lab 06/30/18 2155  GLUCAP 157*   Lipid Profile: No results for input(s): CHOL, HDL, LDLCALC, TRIG, CHOLHDL, LDLDIRECT in the last 72 hours. Thyroid Function Tests: No results for input(s): TSH, T4TOTAL, FREET4, T3FREE, THYROIDAB in the last 72 hours.  Anemia Panel: No results for input(s): VITAMINB12, FOLATE, FERRITIN, TIBC, IRON, RETICCTPCT in the last 72 hours. Urine analysis:    Component Value Date/Time   COLORURINE AMBER (A) 06/30/2018 0410   APPEARANCEUR CLEAR 06/30/2018 0410   LABSPEC 1.021 06/30/2018 0410   PHURINE 5.0 06/30/2018 0410   GLUCOSEU NEGATIVE 06/30/2018 0410   HGBUR NEGATIVE 06/30/2018 0410   BILIRUBINUR NEGATIVE 06/30/2018 0410   KETONESUR NEGATIVE 06/30/2018 0410   PROTEINUR 100 (A) 06/30/2018 0410   NITRITE NEGATIVE 06/30/2018 0410   LEUKOCYTESUR NEGATIVE 06/30/2018 0410   Sepsis Labs: @LABRCNTIP (procalcitonin:4,lacticidven:4) ) Recent Results (from the past 240 hour(s))  Culture, blood (Routine x 2)     Status: None (Preliminary result)   Collection Time: 06/30/18 12:33 AM  Result Value Ref Range Status   Specimen Description BLOOD RIGHT ANTECUBITAL  Final   Special Requests   Final  BOTTLES DRAWN AEROBIC AND ANAEROBIC Blood Culture adequate volume   Culture  Setup Time   Final    GRAM POSITIVE COCCI IN CHAINS IN BOTH AEROBIC AND ANAEROBIC BOTTLES CRITICAL RESULT CALLED TO, READ BACK BY AND VERIFIED WITH: S. COBLE, RN (MCHP) AT 1558 ON 06/30/18 BY C. JESSUP, MLT. Performed at [facility], [address], [place], [place] [address]    Culture GRAM POSITIVE COCCI  Final   Report Status PENDING  Incomplete  Blood Culture ID Panel (Reflexed)     Status: Abnormal   Collection Time: 06/30/18 12:33 AM  Result Value Ref Range Status   Enterococcus species NOT DETECTED NOT DETECTED Final   Listeria monocytogenes NOT DETECTED NOT DETECTED Final   Staphylococcus species NOT DETECTED NOT DETECTED Final   Staphylococcus aureus (BCID) NOT DETECTED NOT DETECTED Final   Streptococcus species DETECTED (A) NOT DETECTED Final    Comment: CRITICAL RESULT CALLED TO, READ BACK BY AND VERIFIED WITH: S. COBLE, RN (MCHP) AT 1558 ON 06/30/18 BY C. JESSUP, MLT.    Streptococcus agalactiae NOT DETECTED NOT  DETECTED Final   Streptococcus pneumoniae NOT DETECTED NOT DETECTED Final   Streptococcus pyogenes DETECTED (A) NOT DETECTED Final    Comment: CRITICAL RESULT CALLED TO, READ BACK BY AND VERIFIED WITH: S. COBLE, RN (MCHP) AT 1558 ON 06/30/18 BY C. JESSUP, MLT.    Acinetobacter baumannii NOT DETECTED NOT DETECTED Final   Enterobacteriaceae species NOT DETECTED NOT DETECTED Final   Enterobacter cloacae complex NOT DETECTED NOT DETECTED Final   Escherichia coli NOT DETECTED NOT DETECTED Final   Klebsiella oxytoca NOT DETECTED NOT DETECTED Final   Klebsiella pneumoniae NOT DETECTED NOT DETECTED Final   Proteus species NOT DETECTED NOT DETECTED Final   Serratia marcescens NOT DETECTED NOT DETECTED Final   Haemophilus influenzae NOT DETECTED NOT DETECTED Final   Neisseria meningitidis NOT DETECTED NOT DETECTED Final   Pseudomonas aeruginosa NOT DETECTED NOT DETECTED Final   Candida albicans NOT DETECTED NOT DETECTED Final   Candida glabrata NOT DETECTED NOT DETECTED Final   Candida krusei NOT DETECTED NOT DETECTED Final   Candida parapsilosis NOT DETECTED NOT DETECTED Final   Candida tropicalis NOT DETECTED NOT DETECTED Final    Comment: Performed at [facility], [address] [place], [place] [address]  Respiratory Panel by PCR     Status: None   Collection Time: 06/30/18  1:25 AM  Result Value Ref Range Status   Adenovirus NOT DETECTED NOT DETECTED Final   Coronavirus 229E NOT DETECTED NOT DETECTED Final    Comment: (NOTE) The Coronavirus on the Respiratory Panel, DOES NOT test for the novel  Coronavirus (2019 nCoV)    Coronavirus HKU1 NOT DETECTED NOT DETECTED Final   Coronavirus NL63 NOT DETECTED NOT DETECTED Final   Coronavirus OC43 NOT DETECTED NOT DETECTED Final   Metapneumovirus NOT DETECTED NOT DETECTED Final   Rhinovirus / Enterovirus NOT DETECTED NOT DETECTED Final   Influenza A NOT DETECTED NOT DETECTED Final   Influenza B NOT DETECTED NOT DETECTED Final    Parainfluenza Virus 1 NOT DETECTED NOT DETECTED Final   Parainfluenza Virus 2 NOT DETECTED NOT DETECTED Final   Parainfluenza Virus 3 NOT DETECTED NOT DETECTED Final   Parainfluenza Virus 4 NOT DETECTED NOT DETECTED Final   Respiratory Syncytial Virus NOT DETECTED NOT DETECTED Final   Bordetella pertussis NOT DETECTED NOT DETECTED Final   Chlamydophila pneumoniae NOT DETECTED NOT DETECTED Final   Mycoplasma pneumoniae NOT DETECTED  NOT DETECTED Final    Comment: Performed at [facility], [address] [place], [place] [address]  Culture, blood (Routine x 2)     Status: None (Preliminary result)   Collection Time: 06/30/18  2:06 AM  Result Value Ref Range Status   Specimen Description BLOOD LEFT HAND  Final   Special Requests   Final    BOTTLES DRAWN AEROBIC AND ANAEROBIC Blood Culture adequate volume   Culture  Setup Time   Final    GRAM POSITIVE COCCI IN CHAINS IN BOTH AEROBIC AND ANAEROBIC BOTTLES CRITICAL RESULT CALLED TO, READ BACK BY AND VERIFIED WITH: S. COBLE, RN (MCHP) AT 1558 ON 06/30/18 BY C. JESSUP, MLT. Performed at [facility], [address] [place], [place] [address]    Culture GRAM POSITIVE COCCI  Final   Report Status PENDING  Incomplete     Radiological Exams on Admission: Dg Chest Portable 1 View  Result Date: 06/30/2018 CLINICAL DATA:  Cough and fever for a few days. EXAM: PORTABLE CHEST 1 VIEW COMPARISON:  Chest radiograph July 23, 2006 FINDINGS: Cardiomediastinal silhouette is normal. No pleural effusions or focal consolidations. Mild bronchitic changes. IMPRESSION: 1. Mild bronchitic changes without focal consolidation. Electronically Signed   By: Awilda Metroourtnay  Bloomer M.D.   On: 06/30/2018 00:45   Ct Renal Stone Study  Result Date: 06/30/2018 CLINICAL DATA:  Flank pain, stone disease suspected EXAM: CT ABDOMEN AND PELVIS WITHOUT CONTRAST TECHNIQUE: Multidetector CT imaging of the abdomen and pelvis was performed following the standard protocol  without IV contrast. COMPARISON:  None. FINDINGS: Lower chest: Coronary calcification. Mild elevation of the left diaphragm, also seen in 2008. Hepatobiliary: No focal liver abnormality.No evidence of biliary obstruction or stone. Pancreas: Unremarkable. Spleen: Unremarkable. Adrenals/Urinary Tract: Negative adrenals. No hydronephrosis or stone. Approximately 2.7 cm right renal cystic density. A smaller left posterior renal cystic density is noted. Unremarkable collapsed bladder. Stomach/Bowel: No obstruction. No evidence of bowel inflammation. Incidental mid duodenal lipoma Vascular/Lymphatic: No acute vascular abnormality. Atherosclerotic calcification. No mass or adenopathy. Reproductive:Penile implant with right paramedian reservoir. Other: No ascites or pneumoperitoneum. 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 IMPRESSION: No acute finding. Electronically Signed   By: Marnee SpringJonathon  Watts M.D.   On: 06/30/2018 04:47     EKG: Independently reviewed.  Sinus rhythm, tachycardia, QTC 462, LAE, LAD.  Assessment/Plan Principal Problem:   Acute bronchitis Active Problems:   Diabetes mellitus without complication (HCC)   Hypertension   Type II diabetes mellitus with renal manifestations (HCC)   Sepsis (HCC)   Acute renal failure superimposed on stage 3 chronic kidney disease (HCC)   Bacteremia due to Streptococcus   Sepsis due to possible acute bronchitis: Patient has fever, chills, cough, shortness of breath.  Chest x-ray showed mild bronchitic change without infiltration, indicating possible acute bronchitis.  Patient is septic with leukocytosis, fever, tachycardia, tachypnea.  Also has bacteremia with streptococcal pyogenes.  Currently hemodynamically stable. I discussed case with Dr. Luciana Axeomer of ID, who recommended treat patient with Rocephin and hold off 2D echo now. If pt does not improve, will need to repeat blood culture.  If blood culture is  persistently positive, will need to consider 2D echo to rule out endocarditis.  - will admit to tele bed as inpt - IV Rocephin (patient received 1 dose of vancomycin and cefepime in ED) - Mucinex for cough  - Atrovent inhaler and prn Xopenex inhaler for SOB - Urine legionella and S. pneumococcal antigen -  Follow up sputum culture - will get Procalcitonin and trend lactic acid level per sepsis protocol - IVF: total of 3L of NS bolus in ED, followed by 75 mL per hour of NS  Bacteremia due to Streptococcus: -see above  Diabetes mellitus without complication (HCC): Last A1c not on record. Patient is taking metformin at home.  Blood sugar 154. -SSI  Hypertension: -hold cozarr since pt is at high risk of developing hypotension due to sepsis and bacteremia and also due to worsening renal function -IV hydralazine as needed  AoCKD-III: Baseline Cre is 1.5 on 03/15/16, pt's Cre is 1.85 and BUN 15 on admission. CT-renal stone study negative. Likely due to dehydration and continuation of ARB, NSAIDs. - IVF as above - Follow up renal function by BMP - Hold mobic and cozarr  COVID subjective risk assessment:  low risk.   Physician PPE: I wear Capr, gown, glove Patient PPE: mask Fever: yes Cough: yes  SOB: yes URI symptoms: yes GI symptoms: no Travel: no Sick contacts: no CBC: leukopenia, lymphopenia-->no BMP: increased BUN/Cr=15/1.85 LFTs: increased AST/ALT/Tbili-->no CRP, LDH: not done Procalcitonin: pending  CXR: hazy bilateral peripheral opacities-->no CT chest: not done COVID subjective risk assessment: low COVID Testing: indicated per current ID/ guidelines -->yes Precaution: Droplet and contact  Inpatient status:  # Patient requires inpatient status due to high intensity of service, high risk for further deterioration and high frequency of surveillance required.  I certify that at the point of admission it is my clinical judgment that the patient will require  inpatient hospital care spanning beyond 2 midnights from the point of admission.  . This patient has multiple chronic comorbidities including hypertension, hyperlipidemia, diabetes mellitus, obesity, CKD 3 . Now patient has presenting with acute bronchitis, sepsis, bacteremia, worsening renal function . The worrisome physical exam findings include coarse breathing sound bilaterally on auscultation . The initial radiographic and laboratory data are worrisome because of sepsis, leukocytosis, worsening renal function, PermCath exchange on chest x-ray . Current medical needs: please see my assessment and plan . Predictability of an adverse outcome (risk): Patient has multiple comorbidities, now presents with acute bronchitis, sepsis, bacteremia and worsening renal function.  Patient is at high risk of deteriorating, will need to be treated in the hospital for at least 2 days.      DVT ppx:  SQ Lovenox Code Status: Full code Family Communication: None at bed side.       Disposition Plan:  Anticipate discharge back to previous home environment Consults called:  I discussed with Dr. Luciana Axe of infectious disease Admission status: Inpatient/tele   Date of Service 06/30/2018    Lorretta Harp [facility]   If 7PM-7AM, please contact night-coverage [email or web address] Password [facility] 06/30/2018, 10:26 PM

## 2018-06-30 NOTE — ED Triage Notes (Signed)
Pt in c/o eval for abnormal lab collected yesterday when pt was seen at this facility, pt from home via [facility], per AVS with the pt blood cultures results were pending last night, pt denies change in symptoms since last night, A&O x4

## 2018-06-30 NOTE — ED Notes (Addendum)
02:06   Specimen Description BLOOD LEFT HAND   Special Requests BOTTLES DRAWN AEROBIC AND ANAEROBIC Blood Culture adequate volume   Culture Setup Time GRAM POSITIVE COCCI IN CHAINS  ANAEROBIC BOTTLE ONLY  CRITICAL RESULT CALLED TO, READ BACK BY AND VERIFIED WITH: S. COBLE, RN (MCHP) AT 1558 ON 06/30/18 BY C. JESSUP, MLT.  Performed at [facility], [address], [place], [place] [address]   Culture GRAM POSITIVE COCCI   Report Status PENDING    Results from blood cultures collected 06/30/18

## 2018-06-30 NOTE — ED Notes (Signed)
Blood cultures pos for gram pos cocci chains   Group a strep in blood   Per dr little pt called and instructed to return to [facility] or [facility] for possible iv antibiotics and admission   06/30/2018  1612  s Hani Patnode rn

## 2018-06-30 NOTE — Discharge Instructions (Signed)
Person Under Monitoring Name: Larry Liu  Location: [address] [place] [address]   Infection Prevention Recommendations for Individuals Confirmed to have, or Being Evaluated for, 2019 Novel Coronavirus (COVID-19) Infection Who Receive Care at Home  Individuals who are confirmed to have, or are being evaluated for, COVID-19 should follow the prevention steps below until a healthcare provider or local or state health department says they can return to normal activities.  Stay home except to get medical care You should restrict activities outside your home, except for getting medical care. Do not go to work, school, or public areas, and do not use public transportation or taxis.  Call ahead before visiting your doctor Before your medical appointment, call the healthcare provider and tell them that you have, or are being evaluated for, COVID-19 infection. This will help the healthcare providers office take steps to keep other people from getting infected. Ask your healthcare provider to call the local or state health department.  Monitor your symptoms Seek prompt medical attention if your illness is worsening (e.g., difficulty breathing). Before going to your medical appointment, call the healthcare provider and tell them that you have, or are being evaluated for, COVID-19 infection. Ask your healthcare provider to call the local or state health department.  Wear a facemask You should wear a facemask that covers your nose and mouth when you are in the same room with other people and when you visit a healthcare provider. People who live with or visit you should also wear a facemask while they are in the same room with you.  Separate yourself from other people in your home As much as possible, you should stay in a different room from other people in your home. Also, you should use a separate bathroom, if available.  Avoid sharing household items You should not share  dishes, drinking glasses, cups, eating utensils, towels, bedding, or other items with other people in your home. After using these items, you should wash them thoroughly with soap and water.  Cover your coughs and sneezes Cover your mouth and nose with a tissue when you cough or sneeze, or you can cough or sneeze into your sleeve. Throw used tissues in a lined trash can, and immediately wash your hands with soap and water for at least 20 seconds or use an alcohol-based hand rub.  Wash your [employer] your hands often and thoroughly with soap and water for at least 20 seconds. You can use an alcohol-based hand sanitizer if soap and water are not available and if your hands are not visibly dirty. Avoid touching your eyes, nose, and mouth with unwashed hands.   Prevention Steps for Caregivers and Household Members of Individuals Confirmed to have, or Being Evaluated for, COVID-19 Infection Being Cared for in the Home  If you live with, or provide care at home for, a person confirmed to have, or being evaluated for, COVID-19 infection please follow these guidelines to prevent infection:  Follow healthcare providers instructions Make sure that you understand and can help the patient follow any healthcare provider instructions for all care.  Provide for the patients basic needs You should help the patient with basic needs in the home and provide support for getting groceries, prescriptions, and other personal needs.  Monitor the patients symptoms If they are getting sicker, call his or her medical provider and tell them that the patient has, or is being evaluated for, COVID-19 infection. This will help the healthcare providers office  take steps to keep other people from getting infected. °Ask the healthcare provider to call the local or state health department. ° °Limit the number of people who have contact with the patient °If possible, have only one caregiver for the patient. °Other  household members should stay in another home or place of residence. If this is not possible, they should stay °in another room, or be separated from the patient as much as possible. Use a separate bathroom, if available. °Restrict visitors who do not have an essential need to be in the home. ° °Keep older adults, very young children, and other sick people away from the patient °Keep older adults, very young children, and those who have compromised immune systems or chronic health conditions away from the patient. This includes people with chronic heart, lung, or kidney conditions, diabetes, and cancer. ° °Ensure good ventilation °Make sure that shared spaces in the home have good air flow, such as from an air conditioner or an opened window, °weather permitting. ° °Wash your hands often °Wash your hands often and thoroughly with soap and water for at least 20 seconds. You can use an alcohol based hand sanitizer if soap and water are not available and if your hands are not visibly dirty. °Avoid touching your eyes, nose, and mouth with unwashed hands. °Use disposable paper towels to dry your hands. If not available, use dedicated cloth towels and replace them when they become wet. ° °Wear a facemask and gloves °Wear a disposable facemask at all times in the room and gloves when you touch or have contact with the patient’s blood, body fluids, and/or secretions or excretions, such as sweat, saliva, sputum, nasal mucus, vomit, urine, or feces.  Ensure the mask fits over your nose and mouth tightly, and do not touch it during use. °Throw out disposable facemasks and gloves after using them. Do not reuse. °Wash your hands immediately after removing your facemask and gloves. °If your personal clothing becomes contaminated, carefully remove clothing and launder. Wash your hands after handling contaminated clothing. °Place all used disposable facemasks, gloves, and other waste in a lined container before disposing them with  other household waste. °Remove gloves and wash your hands immediately after handling these items. ° °Do not share dishes, glasses, or other household items with the patient °Avoid sharing household items. You should not share dishes, drinking glasses, cups, eating utensils, towels, bedding, or other items with a patient who is confirmed to have, or being evaluated for, COVID-19 infection. °After the person uses these items, you should wash them thoroughly with soap and water. ° °Wash laundry thoroughly °Immediately remove and wash clothes or bedding that have blood, body fluids, and/or secretions or excretions, such as sweat, saliva, sputum, nasal mucus, vomit, urine, or feces, on them. °Wear gloves when handling laundry from the patient. °Read and follow directions on labels of laundry or clothing items and detergent. In general, wash and dry with the warmest temperatures recommended on the label. ° °Clean all areas the individual has used often °Clean all touchable surfaces, such as counters, tabletops, doorknobs, bathroom fixtures, toilets, phones, keyboards, tablets, and bedside tables, every day. Also, clean any surfaces that may have blood, body fluids, and/or secretions or excretions on them. °Wear gloves when cleaning surfaces the patient has come in contact with. °Use a diluted bleach solution (e.g., dilute bleach with 1 part bleach and 10 parts water) or a household disinfectant with a label that says EPA-registered for coronaviruses. To make a bleach   solution at home, add 1 tablespoon of bleach to 1 quart (4 cups) of water. For a larger supply, add  cup of bleach to 1 gallon (16 cups) of water. Read labels of cleaning products and follow recommendations provided on product labels. Labels contain instructions for safe and effective use of the cleaning product including precautions you should take when applying the product, such as wearing gloves or eye protection and making sure you have good ventilation  during use of the product. Remove gloves and wash hands immediately after cleaning.  Monitor yourself for signs and symptoms of illness Caregivers and household members are considered close contacts, should monitor their health, and will be asked to limit movement outside of the home to the extent possible. Follow the monitoring steps for close contacts listed on the symptom monitoring form.   ? If you have additional questions, contact your local health department or call the epidemiologist on call at [phone] (available 24/7). ? This guidance is subject to change. For the most up-to-date guidance from [facility], please refer to their website: [email or web address]      Person Under Monitoring Name: Larry Liu  Location: [address] [place] [address]   CORONAVIRUS DISEASE 2019 (COVID-19) Guidance for Persons Under Investigation You are being tested for the virus that causes coronavirus disease 2019 (COVID-19). Public health actions are necessary to ensure protection of your health and the health of others, and to prevent further spread of infection. COVID-19 is caused by a virus that can cause symptoms, such as fever, cough, and shortness of breath. The primary transmission from person to person is by coughing or sneezing. On April 30, 2018, the World Health Organization announced a [employer] Emergency of International Concern and on May 01, 2018 the U.S. Department of Health and Human Services declared a public health emergency. If the virus that causesCOVID-19 spreads in the community, it could have severe public health consequences.  As a person under investigation for COVID-19, the [employer] of Health and [employer], Division of [employer] advises you to adhere to the following guidance until your test results are reported to you. If your test result is positive, you will receive additional  information from your provider and your local health department at that time.  Remain at home until you are cleared by your health provider or public health authorities.  Keep a log of visitors to your home using the form provided. Any visitors to your home must be aware of your isolation status. If you plan to move to a new address or leave the county, notify the local health department in your county. Call a doctor or seek care if you have an urgent medical need. Before seeking medical care, call ahead and get instructions from the provider before arriving at the medical office, clinic or hospital. Notify them that you are being tested for the virus that causes COVID-19 so arrangements can be made, as necessary, to prevent transmission to others in the healthcare setting. Next, notify the local health department in your county. If a medical emergency arises and you need to call 911, inform the first responders that you are being tested for the virus that causes COVID-19. Next, notify the local health department in your county. Adhere to all guidance set forth by the [facility] Division of [employer] for [facility] of patients that is based on guidance from the Center for Disease Control and Prevention with suspected or confirmed COVID-19. It is provided  with this guidance for Persons Under Investigation.  Your health and the health of our community are our top priorities. Public Health officials remain available to provide assistance and counseling to you about COVID-19 and compliance with this guidance.  Provider: ____________________________________________________________ Date: ______/_____/_________  By signing below, you acknowledge that you have read and agree to comply with this Guidance for Persons Under Investigation. ______________________________________________________________ Date: ______/_____/_________  WHO DO I CALL? You can find a list of local health departments here:  http://dean.org/https://www.ncdhhs.gov/divisions/public-health/county-healthdepartments Health Department: ____________________________________________________________________ Contact Name: ________________________________________________________________________ Telephone: ___________________________________________________________________________  [place] DHHS, Division of Public Health, Communicable Disease Branch COVID-19 Guidance for Persons Under Investigation June 06, 2018

## 2018-06-30 NOTE — ED Notes (Signed)
ED TO INPATIENT HANDOFF REPORT  ED Nurse Name and Phone #: [phone]  S Name/Age/Gender Shaaron AdlerJames L Petrenko 78 y.o. male Room/Bed: 018C/018C  Code Status   Code Status: Full Code  Home/SNF/Other Home Patient oriented to: self, place, time and situation Is this baseline? Yes   Triage Complete: Triage complete  Chief Complaint R/O COVID; Abn Labs  Triage Note Pt in c/o eval for abnormal lab collected yesterday when pt was seen at this facility, [facility], per AVS with the pt blood cultures results were pending last night, pt denies change in symptoms since last night, A&O x4   Allergies No Known Allergies  Level of Care/Admitting Diagnosis ED Disposition    ED Disposition Condition Comment   Admit  [facility] [[id number][facility]  Level of Care: Telemetry Medical [104]  Diagnosis: Acute bronchitis [466.0.ICD-9-CM]  Admitting Physician: Lorretta HarpNIU, XILIN [[id number]]  Attending Physician: Lorretta HarpNIU, XILIN [phone][[id number]]  Estimated length of stay: past midnight tomorrow  Certification:: I certify this patient will need inpatient services for at least 2 midnights  Bed request comments: COVID19 rule out, low risk  PT Class (Do Not Modify): Inpatient [101]  PT Acc Code (Do Not Modify): Private [1]       B Medical/Surgery History Past Medical History:  Diagnosis Date  . Diabetes mellitus without complication (HCC)   . High cholesterol   . Hypertension   . Obesity    History reviewed. No pertinent surgical history.   A IV Location/Drains/Wounds Patient Lines/Drains/Airways Status   Active Line/Drains/Airways    Name:   Placement date:   Placement time:   Site:   Days:   Peripheral IV 06/30/18 Left Antecubital   06/30/18    1719    Antecubital   less than 1          Intake/Output Last 24 hours  Intake/Output Summary (Last 24 hours) at 06/30/2018 2213 Last data filed at 06/30/2018 2203 Gross per 24 hour  Intake 500 ml  Output -  Net 500 ml     Labs/Imaging Results for orders placed or performed during the hospital encounter of 06/30/18 (from the past 48 hour(s))  CBC with Differential/Platelet     Status: Abnormal   Collection Time: 06/30/18  5:15 PM  Result Value Ref Range   WBC 23.1 (H) 4.0 - 10.5 K/uL   RBC 3.95 (L) 4.22 - 5.81 MIL/uL   Hemoglobin 11.4 (L) 13.0 - 17.0 g/dL   HCT 98.133.6 (L) 19.139.0 - 47.852.0 %   MCV 85.1 80.0 - 100.0 fL   MCH 28.9 26.0 - 34.0 pg   MCHC 33.9 30.0 - 36.0 g/dL   RDW 29.513.7 62.111.5 - 30.815.5 %   Platelets 214 150 - 400 K/uL   nRBC 0.0 0.0 - 0.2 %   Neutrophils Relative % 75 %   Neutro Abs 17.1 (H) 1.7 - 7.7 K/uL   Lymphocytes Relative 12 %   Lymphs Abs 2.9 0.7 - 4.0 K/uL   Monocytes Relative 11 %   Monocytes Absolute 2.6 (H) 0.1 - 1.0 K/uL   Eosinophils Relative 0 %   Eosinophils Absolute 0.0 0.0 - 0.5 K/uL   Basophils Relative 0 %   Basophils Absolute 0.1 0.0 - 0.1 K/uL   Immature Granulocytes 2 %   Abs Immature Granulocytes 0.46 (H) 0.00 - 0.07 K/uL    Comment: Performed at [facility], [address] [place], [place] [address]  Lactic acid, plasma  Status: None   Collection Time: 06/30/18  5:15 PM  Result Value Ref Range   Lactic Acid, Venous 1.7 0.5 - 1.9 mmol/L    Comment: Performed at [facility], [address], [place], [place] [address]  Comprehensive metabolic panel     Status: Abnormal   Collection Time: 06/30/18  6:43 PM  Result Value Ref Range   Sodium 132 (L) 135 - 145 mmol/L   Potassium 4.0 3.5 - 5.1 mmol/L   Chloride 100 98 - 111 mmol/L   CO2 22 22 - 32 mmol/L   Glucose, Bld 154 (H) 70 - 99 mg/dL   BUN 15 8 - 23 mg/dL   Creatinine, Ser 5.68 (H) 0.61 - 1.24 mg/dL   Calcium 8.4 (L) 8.9 - 10.3 mg/dL   Total Protein 6.8 6.5 - 8.1 g/dL   Albumin 2.9 (L) 3.5 - 5.0 g/dL   AST 31 15 - 41 U/L   ALT 33 0 - 44 U/L   Alkaline Phosphatase 46 38 - 126 U/L   Total Bilirubin 1.2 0.3 - 1.2 mg/dL   GFR calc non Af Amer 35 (L) >60 mL/min   GFR calc Af Amer 40 (L) >60  mL/min   Anion gap 10 5 - 15    Comment: Performed at [facility], [address], [place], [place] [address]  CBG monitoring, ED     Status: Abnormal   Collection Time: 06/30/18  9:55 PM  Result Value Ref Range   Glucose-Capillary 157 (H) 70 - 99 mg/dL   Dg Chest Portable 1 View  Result Date: 06/30/2018 CLINICAL DATA:  Cough and fever for a few days. EXAM: PORTABLE CHEST 1 VIEW COMPARISON:  Chest radiograph July 23, 2006 FINDINGS: Cardiomediastinal silhouette is normal. No pleural effusions or focal consolidations. Mild bronchitic changes. IMPRESSION: 1. Mild bronchitic changes without focal consolidation. Electronically Signed   By: Awilda Metro M.D.   On: 06/30/2018 00:45   Ct Renal Stone Study  Result Date: 06/30/2018 CLINICAL DATA:  Flank pain, stone disease suspected EXAM: CT ABDOMEN AND PELVIS WITHOUT CONTRAST TECHNIQUE: Multidetector CT imaging of the abdomen and pelvis was performed following the standard protocol without IV contrast. COMPARISON:  None. FINDINGS: Lower chest: Coronary calcification. Mild elevation of the left diaphragm, also seen in 2008. Hepatobiliary: No focal liver abnormality.No evidence of biliary obstruction or stone. Pancreas: Unremarkable. Spleen: Unremarkable. Adrenals/Urinary Tract: Negative adrenals. No hydronephrosis or stone. Approximately 2.7 cm right renal cystic density. A smaller left posterior renal cystic density is noted. Unremarkable collapsed bladder. Stomach/Bowel: No obstruction. No evidence of bowel inflammation. Incidental mid duodenal lipoma Vascular/Lymphatic: No acute vascular abnormality. Atherosclerotic calcification. No mass or adenopathy. Reproductive:Penile implant with right paramedian reservoir. Other: No ascites or pneumoperitoneum. 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 IMPRESSION: No acute finding. Electronically Signed   By: Marnee Spring M.D.   On: 06/30/2018 04:47     Pending Labs Unresulted Labs (From admission, onward)    Start     Ordered   06/30/18 2138  Legionella Pneumophila Serogp 1 Ur Ag  Once,   R     06/30/18 2137   06/30/18 2137  Culture, sputum-assessment  Once,   R     06/30/18 2137   06/30/18 2137  Gram stain  Once,   R     06/30/18 2137   06/30/18 2137  HIV antibody (Routine Screening)  Once,   R     06/30/18 2137  06/30/18 2137  Strep pneumoniae urinary antigen  Once,   R     06/30/18 2137   06/30/18 2136  Lactic acid, plasma  Once,   STAT     06/30/18 2135   06/30/18 2136  Procalcitonin  ONCE - STAT,   R     06/30/18 2135          Vitals/Pain Today's Vitals   06/30/18 2100 06/30/18 2115 06/30/18 2204 06/30/18 2205  BP: (!) 142/58 (!) 129/96 116/60   Pulse: 93 93  92  Resp: 16 (!) 21 17 19   Temp:      TempSrc:      SpO2: 100% 98%  97%  Weight:      Height:      PainSc:        Isolation Precautions No active isolations  Medications Medications  0.9 %  sodium chloride infusion ( Intravenous New Bag/Given 06/30/18 2202)  prazosin (MINIPRESS) capsule 2 mg (has no administration in time range)  buPROPion (WELLBUTRIN SR) 12 hr tablet 100 mg (has no administration in time range)  zolpidem (AMBIEN) tablet 5 mg (has no administration in time range)  latanoprost (XALATAN) 0.005 % ophthalmic solution 1 drop (has no administration in time range)  timolol (TIMOPTIC-XR) 0.5 % ophthalmic gel-forming 1 drop (has no administration in time range)  enoxaparin (LOVENOX) injection 40 mg (has no administration in time range)  cefTRIAXone (ROCEPHIN) 2 g in sodium chloride 0.9 % 100 mL IVPB (has no administration in time range)  insulin aspart (novoLOG) injection 0-9 Units (has no administration in time range)  insulin aspart (novoLOG) injection 0-5 Units (0 Units Subcutaneous Not Given 06/30/18 2203)  ondansetron (ZOFRAN) injection 4 mg (has no administration in time range)  hydrALAZINE (APRESOLINE) injection 5 mg (has no  administration in time range)  acetaminophen (TYLENOL) tablet 650 mg (has no administration in time range)  ceFEPIme (MAXIPIME) 2 g in sodium chloride 0.9 % 100 mL IVPB (0 g Intravenous Stopped 06/30/18 1844)  sodium chloride 0.9 % bolus 500 mL (0 mLs Intravenous Stopped 06/30/18 1836)  vancomycin (VANCOCIN) IVPB 1000 mg/200 mL premix (0 mg Intravenous Stopped 06/30/18 1944)  sodium chloride 0.9 % bolus 500 mL (0 mLs Intravenous Stopped 06/30/18 2203)  sodium chloride 0.9 % bolus 2,000 mL (2,000 mLs Intravenous New Bag/Given 06/30/18 2201)    Mobility walks Low fall risk   Focused Assessments Pulmonary Assessment Handoff:  Lung sounds: L Breath Sounds: Rhonchi R Breath Sounds: Clear O2 Device: Room Air        R Recommendations: See Admitting Provider Note  Report given to:   Additional Notes:

## 2018-06-30 NOTE — ED Notes (Signed)
Reviewed d/c instructions with pt, who verbalized understanding and had no outstanding questions.CDC PUI papers completed and given to NS to be faxed out. [employer] & pt labels removed and placed in shred bin. Pt departed in NAD, escorted to front in a wheelchair by this RN.

## 2018-06-30 NOTE — ED Provider Notes (Signed)
[facility] EMERGENCY DEPARTMENT Provider Note   CSN: [id number] Arrival date & time: 06/30/18  1654    History   Chief Complaint Chief Complaint  Patient presents with   Abnormal Lab    HPI Larry Liu is a 78 y.o. male.     HPI Patient presents with several days of fever, cough and shortness of breath.  Was evaluated yesterday for the same.  Noted to be febrile and tachycardic.  Had elevated white blood cell count.  Normal lactic acid.  Blood cultures were sent.  Patient was given IV antibiotics at that time.  He declined admission.  Blood cultures today grew out group A strep.  Patient was called back into the emergency department.  Mostly complains of generalized fatigue.  Denies shortness of breath. 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 No pertinent surgical history.      Home Medications    Prior to Admission medications   Medication Sig Start Date End Date Taking? Authorizing Provider  HYDROcodone-acetaminophen (NORCO) 5-325 MG per tablet Take 1-2 tablets by mouth every 6 (six) hours as needed for moderate pain or severe pain. 07/06/14   Doug Sou, MD  lidocaine (LIDODERM) 5 % Place 1 patch onto the skin daily. Remove & Discard patch within 12 hours or as directed by MD 05/24/16   Arthor Captain, PA-C    Family History No family history on file.  Social History Social History   Tobacco Use   Smoking status: Former Smoker   Smokeless tobacco: Never Used  Substance Use  Topics   Alcohol use: Yes    Comment: occ   Drug use: No     Allergies   Patient has no known allergies.   Review of Systems Review of Systems  Constitutional: Positive for fatigue and fever. Negative for chills.  HENT: Negative for sore throat and trouble swallowing.   Eyes: Negative for visual disturbance.  Respiratory: Positive for cough.   Gastrointestinal: Negative for abdominal pain, diarrhea, nausea and vomiting.  Musculoskeletal: Negative for back pain, myalgias and neck pain.  Skin: Negative for rash and wound.  Neurological: Negative for dizziness, weakness, numbness and headaches.  All other systems reviewed and are negative.    Physical Exam Updated Vital Signs BP 132/79    Pulse 91    Temp (!) 100.5 F (38.1 C) (Oral)    Resp (!) 23    Ht 6' (1.829 m)    Wt (!) 140.6 kg    SpO2 98%    BMI 42.04 kg/m   Physical Exam Vitals signs and nursing note reviewed.  Constitutional:      Appearance: Normal appearance. He is well-developed.  HENT:     Head: Normocephalic and atraumatic.  Eyes:     Pupils: Pupils are equal, round, and reactive to light.  Neck:     Musculoskeletal: Normal range of motion and neck supple.  Cardiovascular:     Rate and Rhythm: Normal rate and regular rhythm.  Heart sounds: No murmur. No friction rub. No gallop.   Pulmonary:     Effort: Pulmonary effort is normal. No respiratory distress.     Breath sounds: Normal breath sounds. No stridor. No wheezing, rhonchi or rales.  Chest:     Chest wall: No tenderness.  Abdominal:     General: Bowel sounds are normal.     Palpations: Abdomen is soft.     Tenderness: There is no abdominal tenderness. There is no guarding or rebound.  Musculoskeletal: Normal range of motion.        General: No swelling, tenderness, deformity or signs of injury.     Right lower leg: No edema.     Left lower leg: No edema.  Skin:    General: Skin is warm and dry.     Findings: No erythema or rash.   Neurological:     General: No focal deficit present.     Mental Status: He is alert and oriented to person, place, and time.  Psychiatric:        Mood and Affect: Mood normal.        Behavior: Behavior normal.      ED Treatments / Results  Labs (all labs ordered are listed, but only abnormal results are displayed) Labs Reviewed  CBC WITH DIFFERENTIAL/PLATELET - Abnormal; Notable for the following components:      Result Value   WBC 23.1 (*)    RBC 3.95 (*)    Hemoglobin 11.4 (*)    HCT 33.6 (*)    Neutro Abs 17.1 (*)    Monocytes Absolute 2.6 (*)    Abs Immature Granulocytes 0.46 (*)    All other components within normal limits  COMPREHENSIVE METABOLIC PANEL - Abnormal; Notable for the following components:   Sodium 132 (*)    Glucose, Bld 154 (*)    Creatinine, Ser 1.85 (*)    Calcium 8.4 (*)    Albumin 2.9 (*)    GFR calc non Af Amer 35 (*)    GFR calc Af Amer 40 (*)    All other components within normal limits  LACTIC ACID, PLASMA    EKG None  Radiology Dg Chest Portable 1 View  Result Date: 06/30/2018 CLINICAL DATA:  Cough and fever for a few days. EXAM: PORTABLE CHEST 1 VIEW COMPARISON:  Chest radiograph July 23, 2006 FINDINGS: Cardiomediastinal silhouette is normal. No pleural effusions or focal consolidations. Mild bronchitic changes. IMPRESSION: 1. Mild bronchitic changes without focal consolidation. Electronically Signed   By: Awilda Metro M.D.   On: 06/30/2018 00:45   Ct Renal Stone Study  Result Date: 06/30/2018 CLINICAL DATA:  Flank pain, stone disease suspected EXAM: CT ABDOMEN AND PELVIS WITHOUT CONTRAST TECHNIQUE: Multidetector CT imaging of the abdomen and pelvis was performed following the standard protocol without IV contrast. COMPARISON:  None. FINDINGS: Lower chest: Coronary calcification. Mild elevation of the left diaphragm, also seen in 2008. Hepatobiliary: No focal liver abnormality.No evidence of biliary obstruction or stone. Pancreas:  Unremarkable. Spleen: Unremarkable. Adrenals/Urinary Tract: Negative adrenals. No hydronephrosis or stone. Approximately 2.7 cm right renal cystic density. A smaller left posterior renal cystic density is noted. Unremarkable collapsed bladder. Stomach/Bowel: No obstruction. No evidence of bowel inflammation. Incidental mid duodenal lipoma Vascular/Lymphatic: No acute vascular abnormality. Atherosclerotic calcification. No mass or adenopathy. Reproductive:Penile implant with right paramedian reservoir. Other: No ascites or pneumoperitoneum. 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 IMPRESSION: No acute finding. Electronically Signed   By: Marja Kays  Watts M.D.   On: 06/30/2018 04:47    Procedures Procedures (including critical care time)  Medications Ordered in ED Medications  ceFEPIme (MAXIPIME) 2 g in sodium chloride 0.9 % 100 mL IVPB (0 g Intravenous Stopped 06/30/18 1844)  sodium chloride 0.9 % bolus 500 mL (0 mLs Intravenous Stopped 06/30/18 1836)  vancomycin (VANCOCIN) IVPB 1000 mg/200 mL premix (0 mg Intravenous Stopped 06/30/18 1944)  sodium chloride 0.9 % bolus 500 mL (500 mLs Intravenous New Bag/Given 06/30/18 1848)     Initial Impression / Assessment and Plan / ED Course  I have reviewed the triage vital signs and the nursing notes.  Pertinent labs & imaging results that were available during my care of the patient were reviewed by me and considered in my medical decision making (see chart for details).        Patient's white blood cell count increased from 14 to 23.  Will admit to hospitalist for bacteremia.  Final Clinical Impressions(s) / ED Diagnoses   Final diagnoses:  Bacteremia due to Streptococcus    ED Discharge Orders    None       Loren Racer, MD 06/30/18 2044

## 2018-06-30 NOTE — ED Provider Notes (Signed)
[facility] EMERGENCY DEPARTMENT Provider Note   CSN: [id number] Arrival date & time: 06/29/18  2351    History   Chief Complaint Chief Complaint  Patient presents with  . Fever  . Cough  . Weakness    HPI Larry Liu is a 78 y.o. male.     Patient presents to the emergency department with a chief complaint of cough, shortness of breath, and fever.  He states his symptoms started a couple of days ago.  He has been around his wife is been sick with similar symptoms.  He denies any recent travel.  Denies any known exposures to COVID-19.  Denies having productive cough.  Denies having any chest pain.  Denies any treatments prior to arrival.  There are no aggravating factors.  Denies any lower extremity swelling or history of CHF or DVT or PE.  The history is provided by the patient. No language interpreter was used.    Past Medical History:  Diagnosis Date  . Diabetes mellitus without complication (HCC)   . High cholesterol   . Hypertension   . Obesity     There are no active problems to display for this patient.   History reviewed. No pertinent surgical history.      Home Medications    Prior to Admission medications   Medication Sig Start Date End Date Taking? Authorizing Provider  HYDROcodone-acetaminophen (NORCO) 5-325 MG per tablet Take 1-2 tablets by mouth every 6 (six) hours as needed for moderate pain or severe pain. 07/06/14   Doug Sou, MD  lidocaine (LIDODERM) 5 % Place 1 patch onto the skin daily. Remove & Discard patch within 12 hours or as directed by MD 05/24/16   Arthor Captain, PA-C    Family History History reviewed. No pertinent family history.  Social History Social History   Tobacco Use  . Smoking status: Former Games developer  . Smokeless tobacco: Never Used  Substance Use Topics  . Alcohol use: Yes    Comment: occ  . Drug use: No     Allergies   Patient has no known allergies.   Review of Systems Review of  Systems  All other systems reviewed and are negative.    Physical Exam Updated Vital Signs BP 133/76 (BP Location: Right Arm)   Pulse (!) 113   Temp (S) (!) 101.8 F (38.8 C) (Oral)   Resp 19   Ht 6' (1.829 m)   Wt 136.1 kg   SpO2 97%   BMI 40.69 kg/m   Physical Exam Vitals signs and nursing note reviewed.  Constitutional:      Appearance: He is well-developed.  HENT:     Head: Normocephalic and atraumatic.  Eyes:     General: No scleral icterus.       Right eye: No discharge.        Left eye: No discharge.     Conjunctiva/sclera: Conjunctivae normal.     Pupils: Pupils are equal, round, and reactive to light.  Neck:     Musculoskeletal: Normal range of motion and neck supple.     Vascular: No JVD.  Cardiovascular:     Rate and Rhythm: Regular rhythm.     Heart sounds: Normal heart sounds. No murmur. No friction rub. No gallop.      Comments: tachycardic  Pulmonary:     Effort: Pulmonary effort is normal. No respiratory distress.     Breath sounds: Normal breath sounds. No wheezing or rales.  Chest:  Chest wall: No tenderness.  Abdominal:     General: There is no distension.     Palpations: Abdomen is soft. There is no mass.     Tenderness: There is no abdominal tenderness. There is no guarding or rebound.  Musculoskeletal: Normal range of motion.        General: No tenderness.  Skin:    General: Skin is warm and dry.  Neurological:     Mental Status: He is alert and oriented to person, place, and time.  Psychiatric:        Behavior: Behavior normal.        Thought Content: Thought content normal.        Judgment: Judgment normal.      ED Treatments / Results  Labs (all labs ordered are listed, but only abnormal results are displayed) Labs Reviewed  COMPREHENSIVE METABOLIC PANEL - Abnormal; Notable for the following components:      Result Value   Sodium 132 (*)    Glucose, Bld 177 (*)    Creatinine, Ser 1.69 (*)    AST 42 (*)    GFR calc non  Af Amer 39 (*)    GFR calc Af Amer 45 (*)    All other components within normal limits  CBC WITH DIFFERENTIAL/PLATELET - Abnormal; Notable for the following components:   WBC 14.1 (*)    Hemoglobin 12.1 (*)    HCT 37.1 (*)    Neutro Abs 11.0 (*)    Monocytes Absolute 1.1 (*)    Abs Immature Granulocytes 0.12 (*)    All other components within normal limits  PROTIME-INR - Abnormal; Notable for the following components:   Prothrombin Time 15.8 (*)    INR 1.3 (*)    All other components within normal limits  URINALYSIS, ROUTINE W REFLEX MICROSCOPIC - Abnormal; Notable for the following components:   Color, Urine AMBER (*)    Protein, ur 100 (*)    Bacteria, UA RARE (*)    All other components within normal limits  CULTURE, BLOOD (ROUTINE X 2)  CULTURE, BLOOD (ROUTINE X 2)  RESPIRATORY PANEL BY PCR  NOVEL CORONAVIRUS, NAA (HOSPITAL ORDER, SEND-OUT TO REF LAB)  LACTIC ACID, PLASMA  LACTIC ACID, PLASMA  INFLUENZA PANEL BY PCR (TYPE A & B)    EKG EKG Interpretation  Date/Time:  Tuesday June 30 2018 00:02:18 EDT Ventricular Rate:  112 PR Interval:    QRS Duration: 85 QT Interval:  338 QTC Calculation: 462 R Axis:   -37 Text Interpretation:  Sinus tachycardia Left axis deviation Rate faster Confirmed by Glynn Octave [phone]) on 06/30/2018 12:05:38 AM   Radiology Dg Chest Portable 1 View  Result Date: 06/30/2018 CLINICAL DATA:  Cough and fever for a few days. EXAM: PORTABLE CHEST 1 VIEW COMPARISON:  Chest radiograph July 23, 2006 FINDINGS: Cardiomediastinal silhouette is normal. No pleural effusions or focal consolidations. Mild bronchitic changes. IMPRESSION: 1. Mild bronchitic changes without focal consolidation. Electronically Signed   By: Awilda Metro M.D.   On: 06/30/2018 00:45   Ct Renal Stone Study  Result Date: 06/30/2018 CLINICAL DATA:  Flank pain, stone disease suspected EXAM: CT ABDOMEN AND PELVIS WITHOUT CONTRAST TECHNIQUE: Multidetector CT imaging of the  abdomen and pelvis was performed following the standard protocol without IV contrast. COMPARISON:  None. FINDINGS: Lower chest: Coronary calcification. Mild elevation of the left diaphragm, also seen in 2008. Hepatobiliary: No focal liver abnormality.No evidence of biliary obstruction or stone. Pancreas: Unremarkable. Spleen: Unremarkable. Adrenals/Urinary Tract:  Negative adrenals. No hydronephrosis or stone. Approximately 2.7 cm right renal cystic density. A smaller left posterior renal cystic density is noted. Unremarkable collapsed bladder. Stomach/Bowel: No obstruction. No evidence of bowel inflammation. Incidental mid duodenal lipoma Vascular/Lymphatic: No acute vascular abnormality. Atherosclerotic calcification. No mass or adenopathy. Reproductive:Penile implant with right paramedian reservoir. Other: No ascites or pneumoperitoneum. 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 IMPRESSION: No acute finding. Electronically Signed   By: Marnee Spring M.D.   On: 06/30/2018 04:47    Procedures Procedures (including critical care time) CRITICAL CARE Performed by: Roxy Horseman SIRS criteria, multiple IV antibiotics  Total critical care time: 32 minutes  Critical care time was exclusive of separately billable procedures and treating other patients.  Critical care was necessary to treat or prevent imminent or life-threatening deterioration.  Critical care was time spent personally by me on the following activities: development of treatment plan with patient and/or surrogate as well as nursing, discussions with consultants, evaluation of patient's response to treatment, examination of patient, obtaining history from patient or surrogate, ordering and performing treatments and interventions, ordering and review of laboratory studies, ordering and review of radiographic studies, pulse oximetry and re-evaluation of patient's condition.  Medications  Ordered in ED Medications  sodium chloride flush (NS) 0.9 % injection 3 mL (has no administration in time range)     Initial Impression / Assessment and Plan / ED Course  I have reviewed the triage vital signs and the nursing notes.  Pertinent labs & imaging results that were available during my care of the patient were reviewed by me and considered in my medical decision making (see chart for details).        Patient presents with fever and cough.  He has been sick for the past several days.  His wife is sick with the same.  Also reports some urinary frequency.  Will check labs, CXR, UA, and reassess.  Patient meets SIRS without source.  CXR shows mild bronchitic changes.  UA shows some RBC, but no evidence of infection.  CT negative for renal stone, or other acute etiology.    5:17 AM Patient feels significantly improved and would like to be discharged.  He was offered admission. Discussed that COVID-19 is not ruled out, and that test will be sent.  He has been given clear instructions regarding home isolation.    Patient has been given clear return precautions.  Larry Liu was evaluated in Emergency Department on 06/30/2018 for the symptoms described in the history of present illness. He was evaluated in the context of the global COVID-19 pandemic, which necessitated consideration that the patient might be at risk for infection with the SARS-CoV-2 virus that causes COVID-19. Institutional protocols and algorithms that pertain to the evaluation of patients at risk for COVID-19 are in a state of rapid change based on information released by regulatory bodies including the CDC and federal and state organizations. These policies and algorithms were followed during the patient's care in the ED.  Patient seen by and discussed with Dr. Manus Gunning, who agrees with plan.  Final Clinical Impressions(s) / ED Diagnoses   Final diagnoses:  Influenza-like illness    ED Discharge Orders     None       Roxy Horseman, PA-C 06/30/18 [id number]    Glynn Octave, MD 06/30/18 [phone]

## 2018-06-30 NOTE — ED Triage Notes (Signed)
Pt reports fever, cough for past couple days. Reports wife sick with similar and being seen here tonight as well. Fever here tonight is 101.19F oral. Also reports some incr'd weakness, unsteady gait, SOB with exertion. Denies travel, additional sick.

## 2018-06-30 NOTE — ED Notes (Signed)
Nurse drawing labs. 

## 2018-07-01 LAB — CBC WITH DIFFERENTIAL/PLATELET
Abs Immature Granulocytes: 0.51 10*3/uL — ABNORMAL HIGH (ref 0.00–0.07)
Basophils Absolute: 0.1 10*3/uL (ref 0.0–0.1)
Basophils Relative: 0 %
Eosinophils Absolute: 0 10*3/uL (ref 0.0–0.5)
Eosinophils Relative: 0 %
HCT: 32.4 % — ABNORMAL LOW (ref 39.0–52.0)
Hemoglobin: 10.8 g/dL — ABNORMAL LOW (ref 13.0–17.0)
Immature Granulocytes: 3 %
Lymphocytes Relative: 12 %
Lymphs Abs: 2.4 10*3/uL (ref 0.7–4.0)
MCH: 28.3 pg (ref 26.0–34.0)
MCHC: 33.3 g/dL (ref 30.0–36.0)
MCV: 84.8 fL (ref 80.0–100.0)
Monocytes Absolute: 1.8 10*3/uL — ABNORMAL HIGH (ref 0.1–1.0)
Monocytes Relative: 9 %
Neutro Abs: 15.9 10*3/uL — ABNORMAL HIGH (ref 1.7–7.7)
Neutrophils Relative %: 76 %
Platelets: 205 10*3/uL (ref 150–400)
RBC: 3.82 MIL/uL — ABNORMAL LOW (ref 4.22–5.81)
RDW: 13.9 % (ref 11.5–15.5)
WBC: 20.6 10*3/uL — ABNORMAL HIGH (ref 4.0–10.5)
nRBC: 0 % (ref 0.0–0.2)

## 2018-07-01 LAB — EXPECTORATED SPUTUM ASSESSMENT W GRAM STAIN, RFLX TO RESP C

## 2018-07-01 LAB — STREP PNEUMONIAE URINARY ANTIGEN: Strep Pneumo Urinary Antigen: NEGATIVE

## 2018-07-01 LAB — HIV ANTIBODY (ROUTINE TESTING W REFLEX): HIV Screen 4th Generation wRfx: NONREACTIVE

## 2018-07-01 LAB — GLUCOSE, CAPILLARY
Glucose-Capillary: 122 mg/dL — ABNORMAL HIGH (ref 70–99)
Glucose-Capillary: 133 mg/dL — ABNORMAL HIGH (ref 70–99)

## 2018-07-01 NOTE — Progress Notes (Addendum)
PROGRESS NOTE    Larry Liu  VZC:[id number] DOB: 1940-07-25 DOA: 06/30/2018 PCP: System, Pcp Not In   Brief Narrative:  Per admitting MD: Larry Liu is a 78 y.o. male with medical history significant of hypertension, hyperlipidemia, diabetes mellitus, obesity, CKD 3, who presents with fever, chills, cough, shortness of breath and bacteremia.  Patient states that he has been having fever, chills, cough, shortness of breath, generalized weakness for more than 2 weeks.  No chest pain.  His cough is nonproductive.  Patient denies nausea, vomiting, diarrhea, abdominal pain, symptoms of UTI or unilateral weakness. Pt was evaluated in ED yesterday for the same. Noted to be febrile and tachycardic. Had elevated white blood cell count. Normal lactic acid. Blood cultures were sent out. COVID19 test was also sent out. He denies any recent travel. Denies any known exposures to COVID-19. Patient was given IV antibiotics at that time. He declined admission. Blood cultures today grew up Streptococcus pyogenes. Patient was called back into the emergency department.   ED Course: pt was found to have WBC 23.1, lactic acid 1.1, INR 1.3, negative urinalysis, negative flu PCR, worsening renal function, temperature 101.8, tachycardia, tachypnea, oxygen saturation 95-100% on room air.  Chest x-ray showed mild bronchitic change.  CT of renal stone study is negative.  Patient is admitted to telemetry bed as inpatient.  4/1 Overnight patient reported feeling somewhat better although still moderately short of breath.   Assessment & Plan:   Principal Problem:   Acute bronchitis Active Problems:   Diabetes mellitus without complication (HCC)   Hypertension   Type II diabetes mellitus with renal manifestations (HCC)   Sepsis (HCC)   Acute renal failure superimposed on stage 3 chronic kidney disease (HCC)   Bacteremia due to Streptococcus   Sepsis due to possible acute bronchitis:  As noted on  admission, patient had fever, chills, cough, shortness of breath.  Chest x-ray showed mild bronchitic change without infiltration, indicating possible acute bronchitis.  Patient is septic with leukocytosis, fever, tachycardia, tachypnea.  Also has bacteremia with streptococcal pyogenes.  Currently hemodynamically stable. Case was discussed with Dr. Luciana Axe of ID, who recommended treat patient with Rocephin and hold off 2D echo now. If pt does not improve, will need to repeat blood culture.  I agree, If blood culture is persistently positive, will need to consider 2D echo to rule out endocarditis.  -Continue with IV Rocephin (patient received 1 dose of vancomycin and cefepime in ED) - Mucinex for cough  - Atrovent inhaler and prn Xopenex inhaler for SOB - Urine legionella pending -RVP negative, flu negative - S. pneumococcal antigen is negative - Follow up sputum culture pendign - Procalcitonin 1.3 , lactic acid 1.7>1.3 - IVF: total of 3L of NS bolus in ED, followed by 75 mL per hour of NS which I will continue for  now - cbc shows wbc down to 20 Bacteremia due to Streptococcus: -see above, f/up s/s  Diabetes mellitus without complication (HCC): Last A1c not on record. Patient is taking metformin at home.  Blood sugar 154. -SSI  Hypertension: -hold cozarr since pt is at high risk of developing hypotension due to sepsis and bacteremia and also due to worsening renal function, bp has been variable110s-140s -IV hydralazine as needed  AoCKD-III: Baseline Cre is 1.5 on 03/15/16, pt's Cre is 1.85 and BUN 15 on admission. CT-renal stone study negative. Likely due to dehydration and continuation of ARB, NSAIDs. - IVF as above - Follow up renal function by  BMP - Hold mobic and cozarr  COVID subjective risk assessment: low risk.   Physician PPE:I wore Capr, gown, glove, hair covering, shoe covers Patient UXL:[id number] Fever: yes Cough: yes  SOB: yes URI symptoms: yes GI symptoms: no  Travel: no Sick contacts: no CBC: leukopenia, lymphopenia-->no BMP: increased BUN/Cr=15/1.85 LFTs: increased AST/ALT/Tbili-->no CRP, LDH: not done Procalcitonin elev 1.3 CXR: hazy bilateral peripheral opacities-->no CT chest: not done COVID subjective risk assessment: low COVID Testing: indicated per current ID/[place] guidelines -->yes Precaution: Droplet and contact  Inpatient status:  # Patient requires inpatient status due to high intensity of service, high risk for further deterioration and high frequency of surveillance required.  I certify that at the point of admission it is my clinical judgment that the patient will require inpatient hospital care spanning beyond 2 midnights from the point of admission.   This patient has multiple chronic comorbidities including hypertension, hyperlipidemia, diabetes mellitus, obesity, CKD 3  Now patient has presenting with acute bronchitis, sepsis, bacteremia, worsening renal function  The worrisome physical exam findings include coarse breathing sound bilaterally on auscultation  The initial radiographic and laboratory data are worrisome because of sepsis, leukocytosis, worsening renal function, PermCath exchange on chest x-ray  Current medical needs: please see my assessment and plan  Predictability of an adverse outcome (risk): Patient has multiple comorbidities, now presents with acute bronchitis, sepsis, bacteremia and worsening renal function.  Patient is at high risk of deteriorating, will need to be treated in the hospital for at least 2 days.  DVT prophylaxis: Lovenox SQ  Code Status: full    Code Status Orders  (From admission, onward)         Start     Ordered   06/30/18 2137  Full code  Continuous     06/30/18 2137        Code Status History    This patient has a current code status but no historical code status.     Family Communication: tried calling wife, no answer Disposition Plan:   Remain inpatient  as above because of complex medical problems. Consults called: Case discussed with infectious disease Admission status: Inpatient   Consultants:   Case discussed with ID  Procedures:  Dg Chest Portable 1 View  Result Date: 06/30/2018 CLINICAL DATA:  Cough and fever for a few days. EXAM: PORTABLE CHEST 1 VIEW COMPARISON:  Chest radiograph July 23, 2006 FINDINGS: Cardiomediastinal silhouette is normal. No pleural effusions or focal consolidations. Mild bronchitic changes. IMPRESSION: 1. Mild bronchitic changes without focal consolidation. Electronically Signed   By: Awilda Metro M.D.   On: 06/30/2018 00:45   Ct Renal Stone Study  Result Date: 06/30/2018 CLINICAL DATA:  Flank pain, stone disease suspected EXAM: CT ABDOMEN AND PELVIS WITHOUT CONTRAST TECHNIQUE: Multidetector CT imaging of the abdomen and pelvis was performed following the standard protocol without IV contrast. COMPARISON:  None. FINDINGS: Lower chest: Coronary calcification. Mild elevation of the left diaphragm, also seen in 2008. Hepatobiliary: No focal liver abnormality.No evidence of biliary obstruction or stone. Pancreas: Unremarkable. Spleen: Unremarkable. Adrenals/Urinary Tract: Negative adrenals. No hydronephrosis or stone. Approximately 2.7 cm right renal cystic density. A smaller left posterior renal cystic density is noted. Unremarkable collapsed bladder. Stomach/Bowel: No obstruction. No evidence of bowel inflammation. Incidental mid duodenal lipoma Vascular/Lymphatic: No acute vascular abnormality. Atherosclerotic calcification. No mass or adenopathy. Reproductive:Penile implant with right paramedian reservoir. Other: No ascites or pneumoperitoneum. 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 IMPRESSION: No acute finding.  Electronically Signed   By: Marnee Spring M.D.   On: 06/30/2018 04:47     Antimicrobials:   Ceftriaxone day 1  Previously received cefepime x1  and vancomycin x1   Subjective: Mildly short of breath, no acute decompensation overnight blood pressure stable had a mild temperature  Objective: Vitals:   06/30/18 2205 06/30/18 2215 06/30/18 2252 07/01/18 0820  BP:  122/70 (!) 122/95 (!) 145/64  Pulse: 92 91 97 86  Resp: 19 (!) 22 20   Temp:   98.6 F (37 C) 98.7 F (37.1 C)  TempSrc:   Oral Oral  SpO2: 97% 98% 100% 100%  Weight:      Height:        Intake/Output Summary (Last 24 hours) at 07/01/2018 1151 Last data filed at 07/01/2018 1032 Gross per 24 hour  Intake 875 ml  Output 1350 ml  Net -475 ml   Filed Weights   06/30/18 1711  Weight: (!) 140.6 kg    Examination:  General exam: Appears calm and comfortable  Respiratory system: Clear to auscultation. Respiratory effort normal. Cardiovascular system: S1 & S2 heard, RRR. No JVD, murmurs, rubs, gallops or clicks. No pedal edema. Gastrointestinal system: Abdomen is nondistended, soft and nontender. No organomegaly or masses felt. Normal bowel sounds heard. Central nervous system: Alert and oriented. No focal neurological deficits. Extremities: Symmetric 5 x 5 power. Skin: No rashes, lesions or ulcers Psychiatry: Judgement and insight appear normal. Mood & affect appropriate.     Data Reviewed: I have personally reviewed following labs and imaging studies  CBC: Recent Labs  Lab 06/30/18 0033 06/30/18 1715 07/01/18 0858  WBC 14.1* 23.1* 20.6*  NEUTROABS 11.0* 17.1* 15.9*  HGB 12.1* 11.4* 10.8*  HCT 37.1* 33.6* 32.4*  MCV 84.7 85.1 84.8  PLT 217 214 205   Basic Metabolic Panel: Recent Labs  Lab 06/30/18 0033 06/30/18 1843  NA 132* 132*  K 3.7 4.0  CL 98 100  CO2 24 22  GLUCOSE 177* 154*  BUN 16 15  CREATININE 1.69* 1.85*  CALCIUM 9.2 8.4*   GFR: Estimated Creatinine Clearance: 49.4 mL/min (A) (by C-G formula based on SCr of 1.85 mg/dL (H)). Liver Function Tests: Recent Labs  Lab 06/30/18 0033 06/30/18 1843  AST 42* 31  ALT 42 33   ALKPHOS 48 46  BILITOT 1.0 1.2  PROT 7.5 6.8  ALBUMIN 3.5 2.9*   No results for input(s): LIPASE, AMYLASE in the last 168 hours. No results for input(s): AMMONIA in the last 168 hours. Coagulation Profile: Recent Labs  Lab 06/30/18 0033  INR 1.3*   Cardiac Enzymes: No results for input(s): CKTOTAL, CKMB, CKMBINDEX, TROPONINI in the last 168 hours. BNP (last 3 results) No results for input(s): PROBNP in the last 8760 hours. HbA1C: No results for input(s): HGBA1C in the last 72 hours. CBG: Recent Labs  Lab 06/30/18 2155 07/01/18 0826 07/01/18 1123  GLUCAP 157* 122* 133*   Lipid Profile: No results for input(s): CHOL, HDL, LDLCALC, TRIG, CHOLHDL, LDLDIRECT in the last 72 hours. Thyroid Function Tests: No results for input(s): TSH, T4TOTAL, FREET4, T3FREE, THYROIDAB in the last 72 hours. Anemia Panel: No results for input(s): VITAMINB12, FOLATE, FERRITIN, TIBC, IRON, RETICCTPCT in the last 72 hours. Sepsis Labs: Recent Labs  Lab 06/30/18 0033 06/30/18 0151 06/30/18 1715 06/30/18 2156  PROCALCITON  --   --   --  1.30  LATICACIDVEN 1.4 1.7 1.7 1.3    Recent Results (from the past 240 hour(s))  Culture, blood (  Routine x 2)     Status: Abnormal (Preliminary result)   Collection Time: 06/30/18 12:33 AM  Result Value Ref Range Status   Specimen Description BLOOD RIGHT ANTECUBITAL  Final   Special Requests   Final    BOTTLES DRAWN AEROBIC AND ANAEROBIC Blood Culture adequate volume   Culture  Setup Time   Final    GRAM POSITIVE COCCI IN CHAINS IN BOTH AEROBIC AND ANAEROBIC BOTTLES CRITICAL RESULT CALLED TO, READ BACK BY AND VERIFIED WITH: S. COBLE, RN (MCHP) AT 1558 ON 06/30/18 BY C. JESSUP, MLT.    Culture (A)  Final    GROUP A STREP (S.PYOGENES) ISOLATED SUSCEPTIBILITIES TO FOLLOW Performed at [facility], [address] [place], [place] [address]    Report Status PENDING  Incomplete  Blood Culture ID Panel (Reflexed)     Status: Abnormal   Collection  Time: 06/30/18 12:33 AM  Result Value Ref Range Status   Enterococcus species NOT DETECTED NOT DETECTED Final   Listeria monocytogenes NOT DETECTED NOT DETECTED Final   Staphylococcus species NOT DETECTED NOT DETECTED Final   Staphylococcus aureus (BCID) NOT DETECTED NOT DETECTED Final   Streptococcus species DETECTED (A) NOT DETECTED Final    Comment: CRITICAL RESULT CALLED TO, READ BACK BY AND VERIFIED WITH: S. COBLE, RN (MCHP) AT 1558 ON 06/30/18 BY C. JESSUP, MLT.    Streptococcus agalactiae NOT DETECTED NOT DETECTED Final   Streptococcus pneumoniae NOT DETECTED NOT DETECTED Final   Streptococcus pyogenes DETECTED (A) NOT DETECTED Final    Comment: CRITICAL RESULT CALLED TO, READ BACK BY AND VERIFIED WITH: S. COBLE, RN (MCHP) AT 1558 ON 06/30/18 BY C. JESSUP, MLT.    Acinetobacter baumannii NOT DETECTED NOT DETECTED Final   Enterobacteriaceae species NOT DETECTED NOT DETECTED Final   Enterobacter cloacae complex NOT DETECTED NOT DETECTED Final   Escherichia coli NOT DETECTED NOT DETECTED Final   Klebsiella oxytoca NOT DETECTED NOT DETECTED Final   Klebsiella pneumoniae NOT DETECTED NOT DETECTED Final   Proteus species NOT DETECTED NOT DETECTED Final   Serratia marcescens NOT DETECTED NOT DETECTED Final   Haemophilus influenzae NOT DETECTED NOT DETECTED Final   Neisseria meningitidis NOT DETECTED NOT DETECTED Final   Pseudomonas aeruginosa NOT DETECTED NOT DETECTED Final   Candida albicans NOT DETECTED NOT DETECTED Final   Candida glabrata NOT DETECTED NOT DETECTED Final   Candida krusei NOT DETECTED NOT DETECTED Final   Candida parapsilosis NOT DETECTED NOT DETECTED Final   Candida tropicalis NOT DETECTED NOT DETECTED Final    Comment: Performed at [facility], [address] [place], [place] [address]  Respiratory Panel by PCR     Status: None   Collection Time: 06/30/18  1:25 AM  Result Value Ref Range Status   Adenovirus NOT DETECTED NOT DETECTED Final   Coronavirus  229E NOT DETECTED NOT DETECTED Final    Comment: (NOTE) The Coronavirus on the Respiratory Panel, DOES NOT test for the novel  Coronavirus (2019 nCoV)    Coronavirus HKU1 NOT DETECTED NOT DETECTED Final   Coronavirus NL63 NOT DETECTED NOT DETECTED Final   Coronavirus OC43 NOT DETECTED NOT DETECTED Final   Metapneumovirus NOT DETECTED NOT DETECTED Final   Rhinovirus / Enterovirus NOT DETECTED NOT DETECTED Final   Influenza A NOT DETECTED NOT DETECTED Final   Influenza B NOT DETECTED NOT DETECTED Final   Parainfluenza Virus 1 NOT DETECTED NOT DETECTED Final   Parainfluenza Virus 2 NOT DETECTED NOT DETECTED Final  Parainfluenza Virus 3 NOT DETECTED NOT DETECTED Final   Parainfluenza Virus 4 NOT DETECTED NOT DETECTED Final   Respiratory Syncytial Virus NOT DETECTED NOT DETECTED Final   Bordetella pertussis NOT DETECTED NOT DETECTED Final   Chlamydophila pneumoniae NOT DETECTED NOT DETECTED Final   Mycoplasma pneumoniae NOT DETECTED NOT DETECTED Final    Comment: Performed at [facility], [address] [place], [place] [address]  Culture, blood (Routine x 2)     Status: None (Preliminary result)   Collection Time: 06/30/18  2:06 AM  Result Value Ref Range Status   Specimen Description BLOOD LEFT HAND  Final   Special Requests   Final    BOTTLES DRAWN AEROBIC AND ANAEROBIC Blood Culture adequate volume   Culture  Setup Time   Final    GRAM POSITIVE COCCI IN CHAINS IN BOTH AEROBIC AND ANAEROBIC BOTTLES CRITICAL RESULT CALLED TO, READ BACK BY AND VERIFIED WITH: S. COBLE, RN (MCHP) AT 1558 ON 06/30/18 BY C. JESSUP, MLT. Performed at [facility], [address], [place], [place] [address]    Culture GRAM POSITIVE COCCI  Final   Report Status PENDING  Incomplete         Radiology Studies: Dg Chest Portable 1 View  Result Date: 06/30/2018 CLINICAL DATA:  Cough and fever for a few days. EXAM: PORTABLE CHEST 1 VIEW COMPARISON:  Chest radiograph July 23, 2006 FINDINGS:  Cardiomediastinal silhouette is normal. No pleural effusions or focal consolidations. Mild bronchitic changes. IMPRESSION: 1. Mild bronchitic changes without focal consolidation. Electronically Signed   By: Awilda Metro M.D.   On: 06/30/2018 00:45   Ct Renal Stone Study  Result Date: 06/30/2018 CLINICAL DATA:  Flank pain, stone disease suspected EXAM: CT ABDOMEN AND PELVIS WITHOUT CONTRAST TECHNIQUE: Multidetector CT imaging of the abdomen and pelvis was performed following the standard protocol without IV contrast. COMPARISON:  None. FINDINGS: Lower chest: Coronary calcification. Mild elevation of the left diaphragm, also seen in 2008. Hepatobiliary: No focal liver abnormality.No evidence of biliary obstruction or stone. Pancreas: Unremarkable. Spleen: Unremarkable. Adrenals/Urinary Tract: Negative adrenals. No hydronephrosis or stone. Approximately 2.7 cm right renal cystic density. A smaller left posterior renal cystic density is noted. Unremarkable collapsed bladder. Stomach/Bowel: No obstruction. No evidence of bowel inflammation. Incidental mid duodenal lipoma Vascular/Lymphatic: No acute vascular abnormality. Atherosclerotic calcification. No mass or adenopathy. Reproductive:Penile implant with right paramedian reservoir. Other: No ascites or pneumoperitoneum. 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 IMPRESSION: No acute finding. Electronically Signed   By: Marnee Spring M.D.   On: 06/30/2018 04:47        Scheduled Meds: . brimonidine  1 drop Both Eyes TID  . brinzolamide  1 drop Both Eyes TID  . buPROPion  100 mg Oral BID WC  . enoxaparin (LOVENOX) injection  40 mg Subcutaneous Daily  . insulin aspart  0-5 Units Subcutaneous QHS  . insulin aspart  0-9 Units Subcutaneous TID WC  . latanoprost  1 drop Both Eyes QHS  . timolol  1 drop Both Eyes q morning - 10a   Continuous Infusions: . sodium chloride 75 mL/hr at 06/30/18 2202  .  cefTRIAXone (ROCEPHIN)  IV 2 g (07/01/18 0809)     LOS: 1 day    Time spent: 66 MIN    Burke Keels, MD [facility]  If 7PM-7AM, please contact night-coverage  07/01/2018, 11:51 AM

## 2018-07-02 DIAGNOSIS — E1122 Type 2 diabetes mellitus with diabetic chronic kidney disease: Secondary | ICD-10-CM

## 2018-07-02 LAB — BASIC METABOLIC PANEL
Anion gap: 7 (ref 5–15)
BUN: 18 mg/dL (ref 8–23)
CO2: 22 mmol/L (ref 22–32)
Calcium: 8.6 mg/dL — ABNORMAL LOW (ref 8.9–10.3)
Chloride: 108 mmol/L (ref 98–111)
Creatinine, Ser: 1.48 mg/dL — ABNORMAL HIGH (ref 0.61–1.24)
GFR calc Af Amer: 53 mL/min — ABNORMAL LOW (ref >60)
GFR calc non Af Amer: 45 mL/min — ABNORMAL LOW (ref >60)
Glucose, Bld: 131 mg/dL — ABNORMAL HIGH (ref 70–99)
Potassium: 3.8 mmol/L (ref 3.5–5.1)
Sodium: 137 mmol/L (ref 135–145)

## 2018-07-02 LAB — GLUCOSE, CAPILLARY
Glucose-Capillary: 126 mg/dL — ABNORMAL HIGH (ref 70–99)
Glucose-Capillary: 109 mg/dL — ABNORMAL HIGH (ref 70–99)
Glucose-Capillary: 111 mg/dL — ABNORMAL HIGH (ref 70–99)
Glucose-Capillary: 119 mg/dL — ABNORMAL HIGH (ref 70–99)

## 2018-07-02 LAB — CBC WITH DIFFERENTIAL/PLATELET
Abs Immature Granulocytes: 0.75 10*3/uL — ABNORMAL HIGH (ref 0.00–0.07)
Basophils Absolute: 0.1 10*3/uL (ref 0.0–0.1)
Basophils Relative: 1 %
Eosinophils Absolute: 0.1 10*3/uL (ref 0.0–0.5)
Eosinophils Relative: 1 %
HCT: 32.1 % — ABNORMAL LOW (ref 39.0–52.0)
Hemoglobin: 11.1 g/dL — ABNORMAL LOW (ref 13.0–17.0)
Immature Granulocytes: 4 %
Lymphocytes Relative: 14 %
Lymphs Abs: 2.6 10*3/uL (ref 0.7–4.0)
MCH: 29 pg (ref 26.0–34.0)
MCHC: 34.6 g/dL (ref 30.0–36.0)
MCV: 83.8 fL (ref 80.0–100.0)
Monocytes Absolute: 1.4 10*3/uL — ABNORMAL HIGH (ref 0.1–1.0)
Monocytes Relative: 8 %
Neutro Abs: 13.1 10*3/uL — ABNORMAL HIGH (ref 1.7–7.7)
Neutrophils Relative %: 72 %
Platelets: 249 10*3/uL (ref 150–400)
RBC: 3.83 MIL/uL — ABNORMAL LOW (ref 4.22–5.81)
RDW: 14.2 % (ref 11.5–15.5)
WBC: 18 10*3/uL — ABNORMAL HIGH (ref 4.0–10.5)
nRBC: 0 % (ref 0.0–0.2)

## 2018-07-02 LAB — CULTURE, BLOOD (ROUTINE X 2)
SPECIAL REQUESTS: ADEQUATE
Special Requests: ADEQUATE

## 2018-07-02 LAB — LEGIONELLA PNEUMOPHILA SEROGP 1 UR AG: L. pneumophila Serogp 1 Ur Ag: NEGATIVE

## 2018-07-02 MED ORDER — LEVOFLOXACIN 500 MG PO TABS: 500.0000 mg | ORAL_TABLET | Freq: Every day | ORAL | 0 refills | Status: AC

## 2018-07-02 NOTE — Discharge Summary (Signed)
Physician Discharge Summary  Larry Liu ZOX:[id number] DOB: November 02, 1940 DOA: 06/30/2018  PCP: System, Pcp Not In  Admit date: 06/30/2018 Discharge date: 07/02/2018  Admitted From: Inpatient Disposition: home  Recommendations for Outpatient Follow-up:  1. Follow up with PCP in 1-2 weeks 2. Please obtain BMP/CBC in one week 3. Please follow up on the following pending results: Coronavirus testing  Home Health:No Equipment/Devices: None Discharge Condition:Good CODE STATUS:Full code Diet recommendation: Diabetic diet  Brief/Interim Summary: Larry Aliment Newmanis a 78 y.o.malewith medical history significant ofhypertension, hyperlipidemia, diabetes mellitus, obesity, CKD 3, who presents with fever, chills, cough, shortness of breath and bacteremia.  Patient states that he has been having fever, chills, cough, shortness of breath, generalized weakness for more than 2 weeks.No chest pain. His cough is nonproductive. Patient denies nausea,vomiting, diarrhea, abdominal pain, symptoms of UTI or unilateral weakness. Pt wasevaluatedin EDyesterday for the same. Noted to be febrile and tachycardic. Had elevated white blood cell count. Normal lactic acid. Blood cultures were sentout.COVID19 test was also sent out.He denies any recent travel. Denies any known exposures to COVID-19.Patient was given IV antibiotics at that time. He declined admission.Blood cultures today grewupStreptococcus pyogenes.Patient was called back into the emergency department.  ED Course:pt was found to have WBC 23.1, lactic acid 1.1, INR 1.3, negative urinalysis, negative flu PCR, worsening renal function, temperature 101.8, tachycardia, tachypnea, oxygen saturation 95-100% on room air. Chest x-ray showed mild bronchiticchange. CT of renal stone study is negative.Patient is admitted to telemetry bed as inpatient.  Hospital course: Sepsis due to bronchitis.  At the time of admission patient had  fevers, chills, cough, shortness of breath.  His chest x-ray showed bronchitic changes without acute filtrate.  At this time admission the case was discussed with infectious disease recommended admission with IV antibiotics.  Blood cultures were obtained which were positive for Streptococcus.  Sensitivities showed broad sensitivity.  Patient was sensitive to the antibiotics on while in the hospital and will be discharged to complete a total 14 days.  Patient RVP which was negative, flu which is negative, pneumococcal antigen which was negative, his lactate went from 1.7-1.3 and he did receive sepsis protocol bolus in the ER.  Corona testing was sent out and remains pending.  An extensive discussion on the appropriate protocol after discharge until testing results are available.  He is to quarantine as discussed.  While in the hospital will be treated patient's chronic medical problems include diabetes mellitus with sliding scale insulin he can resume his metformin, for his hypertension resume his home medications on discharge.  He does have chronic kidney disease with borderline creatinine for the use of his diabetic medications.  He will need close monitoring with his PCP with possible change in his diabetic medication regimen if no improvement.  Patient will be discharged home today with outpatient follow-up with his PCP.  On both visits when I saw the patient I will write gown, gloves, hair covering, shoe covers and CAPR.  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   Call MD for:  difficulty breathing, headache or visual disturbances   Complete by:  As directed    Call MD for:  persistant nausea and vomiting   Complete by:  As directed    Call MD for:  temperature >100.4   Complete by:  As directed    Diet Carb Modified   Complete by:  As directed    Discharge instructions   Complete by:  As directed    Return to local ER for any acute change in medical condition to include but not limited to nausea vomiting fevers chills chest pain shortness of breath   Increase activity slowly   Complete by:  As directed      Allergies as of 07/02/2018   No Known Allergies     Medication List    TAKE these medications   amoxicillin 500 MG capsule Commonly known as:  AMOXIL Take 2,000 mg by mouth See admin instructions. Take 2,000 mg by mouth one hour prior to dental procedures   benzonatate 100 MG capsule Commonly known as:  TESSALON Take 100 mg by mouth 2 (two) times daily as needed for cough.   buPROPion 100 MG 12 hr tablet Commonly known as:  WELLBUTRIN SR Take 100 mg by mouth 2 (two) times daily. MORNING and NOON   HYDROcodone-acetaminophen 5-325 MG tablet Commonly known as:  Norco Take 1-2 tablets by mouth every 6 (six) hours as needed for moderate pain or severe pain.   latanoprost 0.005 % ophthalmic solution Commonly known as:  XALATAN Place 1 drop into both eyes at bedtime.   levofloxacin 500 MG tablet Commonly known as:  Levaquin Take 1 tablet (500 mg total) by mouth daily for 12 days.   lidocaine 5 % Commonly known as:  Lidoderm Place 1 patch onto the skin daily. Remove & Discard patch within 12 hours or as directed by MD   losartan 100 MG tablet Commonly known as:  COZAAR Take 100 mg by mouth daily.   meloxicam 15 MG tablet Commonly known as:  MOBIC Take 15 mg by mouth daily as needed (for knee pain).   metFORMIN 1000 MG  tablet Commonly known as:  GLUCOPHAGE Take 1,000 mg by mouth 2 (two) times daily with a meal.   prazosin 2 MG capsule Commonly known as:  MINIPRESS Take 2 mg by mouth at bedtime as needed (for nightmares).   Simbrinza 1-0.2 % Susp Generic drug:  Brinzolamide-Brimonidine Place 1 drop into both eyes 3 (three) times daily.   timolol 0.5 % ophthalmic gel-forming Commonly known as:  TIMOPTIC-XR Place 1 drop into both eyes every morning.   zolpidem 10 MG tablet Commonly known as:  AMBIEN Take 10 mg by mouth at bedtime.       No Known Allergies  Consultations:  Case was discussed with infectious disease by the admitting physician.   Procedures/Studies: Dg Chest Portable 1 View  Result Date: 06/30/2018 CLINICAL DATA:  Cough and fever for a few days. EXAM: PORTABLE CHEST 1 VIEW COMPARISON:  Chest radiograph July 23, 2006 FINDINGS: Cardiomediastinal silhouette is normal. No pleural effusions or focal consolidations. Mild bronchitic changes. IMPRESSION: 1. Mild bronchitic changes without focal consolidation. Electronically Signed   By: Awilda Metro M.D.   On: 06/30/2018 00:45   Ct Renal Stone Study  Result Date: 06/30/2018 CLINICAL DATA:  Flank pain, stone disease suspected EXAM: CT ABDOMEN AND PELVIS WITHOUT CONTRAST TECHNIQUE: Multidetector CT imaging of  the abdomen and pelvis was performed following the standard protocol without IV contrast. COMPARISON:  None. FINDINGS: Lower chest: Coronary calcification. Mild elevation of the left diaphragm, also seen in 2008. Hepatobiliary: No focal liver abnormality.No evidence of biliary obstruction or stone. Pancreas: Unremarkable. Spleen: Unremarkable. Adrenals/Urinary Tract: Negative adrenals. No hydronephrosis or stone. Approximately 2.7 cm right renal cystic density. A smaller left posterior renal cystic density is noted. Unremarkable collapsed bladder. Stomach/Bowel: No obstruction. No evidence of bowel inflammation. Incidental mid  duodenal lipoma Vascular/Lymphatic: No acute vascular abnormality. Atherosclerotic calcification. No mass or adenopathy. Reproductive:Penile implant with right paramedian reservoir. Other: No ascites or pneumoperitoneum. 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 IMPRESSION: No acute finding. Electronically Signed   By: Marnee Spring M.D.   On: 06/30/2018 04:47       Subjective: Patient reported feeling completely normal at baseline, no respiratory compromise, no abdominal pain, no chills shakes or RIGORS.  Discharge Exam: Vitals:   07/02/18 0500 07/02/18 0800  BP: (!) 154/72 135/84  Pulse:  82  Resp: 20 20  Temp: 98.9 F (37.2 C) 98.6 F (37 C)  SpO2: 100%    Vitals:   07/01/18 1713 07/01/18 2130 07/02/18 0500 07/02/18 0800  BP: (!) 154/91 (!) 164/68 (!) 154/72 135/84  Pulse: 80   82  Resp:  20 20 20   Temp: 98.6 F (37 C) 98.4 F (36.9 C) 98.9 F (37.2 C) 98.6 F (37 C)  TempSrc: Oral Oral Oral Oral  SpO2: 100% 100% 100%   Weight:      Height:        General: Pt is alert, awake, not in acute distress Cardiovascular: RRR, S1/S2 +, no rubs, no gallops Respiratory: CTA bilaterally, no wheezing, no rhonchi Abdominal: Soft, NT, ND, bowel sounds + Extremities: no edema, no cyanosis    The results of significant diagnostics from this hospitalization (including imaging, microbiology, ancillary and laboratory) are listed below for reference.     Microbiology: Recent Results (from the past 240 hour(s))  Culture, blood (Routine x 2)     Status: Abnormal (Preliminary result)   Collection Time: 06/30/18 12:33 AM  Result Value Ref Range Status   Specimen Description BLOOD RIGHT ANTECUBITAL  Final   Special Requests   Final    BOTTLES DRAWN AEROBIC AND ANAEROBIC Blood Culture adequate volume   Culture  Setup Time   Final    GRAM POSITIVE COCCI IN CHAINS IN BOTH AEROBIC AND ANAEROBIC BOTTLES CRITICAL RESULT CALLED TO, READ  BACK BY AND VERIFIED WITH: S. COBLE, RN (MCHP) AT 1558 ON 06/30/18 BY C. JESSUP, MLT. Performed at [facility], [address] [place], [place] [address]    Culture STREPTOCOCCUS PYOGENES (A)  Final   Report Status PENDING  Incomplete   Organism ID, Bacteria STREPTOCOCCUS PYOGENES  Final      Susceptibility   Streptococcus pyogenes - MIC*    PENICILLIN <=0.06 SENSITIVE Sensitive     CEFTRIAXONE <=0.12 SENSITIVE Sensitive     ERYTHROMYCIN <=0.12 SENSITIVE Sensitive     LEVOFLOXACIN 0.5 SENSITIVE Sensitive     VANCOMYCIN 0.5 SENSITIVE Sensitive     * STREPTOCOCCUS PYOGENES  Blood Culture ID Panel (Reflexed)     Status: Abnormal   Collection Time: 06/30/18 12:33 AM  Result Value Ref Range Status   Enterococcus species NOT DETECTED NOT DETECTED Final   Listeria monocytogenes NOT DETECTED NOT DETECTED Final   Staphylococcus species NOT DETECTED NOT DETECTED Final   Staphylococcus aureus (  BCID) NOT DETECTED NOT DETECTED Final   Streptococcus species DETECTED (A) NOT DETECTED Final    Comment: CRITICAL RESULT CALLED TO, READ BACK BY AND VERIFIED WITH: S. COBLE, RN (MCHP) AT 1558 ON 06/30/18 BY C. JESSUP, MLT.    Streptococcus agalactiae NOT DETECTED NOT DETECTED Final   Streptococcus pneumoniae NOT DETECTED NOT DETECTED Final   Streptococcus pyogenes DETECTED (A) NOT DETECTED Final    Comment: CRITICAL RESULT CALLED TO, READ BACK BY AND VERIFIED WITH: S. COBLE, RN (MCHP) AT 1558 ON 06/30/18 BY C. JESSUP, MLT.    Acinetobacter baumannii NOT DETECTED NOT DETECTED Final   Enterobacteriaceae species NOT DETECTED NOT DETECTED Final   Enterobacter cloacae complex NOT DETECTED NOT DETECTED Final   Escherichia coli NOT DETECTED NOT DETECTED Final   Klebsiella oxytoca NOT DETECTED NOT DETECTED Final   Klebsiella pneumoniae NOT DETECTED NOT DETECTED Final   Proteus species NOT DETECTED NOT DETECTED Final   Serratia marcescens NOT DETECTED NOT DETECTED Final   Haemophilus influenzae NOT  DETECTED NOT DETECTED Final   Neisseria meningitidis NOT DETECTED NOT DETECTED Final   Pseudomonas aeruginosa NOT DETECTED NOT DETECTED Final   Candida albicans NOT DETECTED NOT DETECTED Final   Candida glabrata NOT DETECTED NOT DETECTED Final   Candida krusei NOT DETECTED NOT DETECTED Final   Candida parapsilosis NOT DETECTED NOT DETECTED Final   Candida tropicalis NOT DETECTED NOT DETECTED Final    Comment: Performed at [facility], [address] [place], [place] [address]  Respiratory Panel by PCR     Status: None   Collection Time: 06/30/18  1:25 AM  Result Value Ref Range Status   Adenovirus NOT DETECTED NOT DETECTED Final   Coronavirus 229E NOT DETECTED NOT DETECTED Final    Comment: (NOTE) The Coronavirus on the Respiratory Panel, DOES NOT test for the novel  Coronavirus (2019 nCoV)    Coronavirus HKU1 NOT DETECTED NOT DETECTED Final   Coronavirus NL63 NOT DETECTED NOT DETECTED Final   Coronavirus OC43 NOT DETECTED NOT DETECTED Final   Metapneumovirus NOT DETECTED NOT DETECTED Final   Rhinovirus / Enterovirus NOT DETECTED NOT DETECTED Final   Influenza A NOT DETECTED NOT DETECTED Final   Influenza B NOT DETECTED NOT DETECTED Final   Parainfluenza Virus 1 NOT DETECTED NOT DETECTED Final   Parainfluenza Virus 2 NOT DETECTED NOT DETECTED Final   Parainfluenza Virus 3 NOT DETECTED NOT DETECTED Final   Parainfluenza Virus 4 NOT DETECTED NOT DETECTED Final   Respiratory Syncytial Virus NOT DETECTED NOT DETECTED Final   Bordetella pertussis NOT DETECTED NOT DETECTED Final   Chlamydophila pneumoniae NOT DETECTED NOT DETECTED Final   Mycoplasma pneumoniae NOT DETECTED NOT DETECTED Final    Comment: Performed at [facility], [address] [place], [place] [address]  Culture, blood (Routine x 2)     Status: Abnormal (Preliminary result)   Collection Time: 06/30/18  2:06 AM  Result Value Ref Range Status   Specimen Description BLOOD LEFT HAND  Final   Special  Requests   Final    BOTTLES DRAWN AEROBIC AND ANAEROBIC Blood Culture adequate volume   Culture  Setup Time   Final    GRAM POSITIVE COCCI IN CHAINS IN BOTH AEROBIC AND ANAEROBIC BOTTLES CRITICAL RESULT CALLED TO, READ BACK BY AND VERIFIED WITH: S. COBLE, RN (MCHP) AT 1558 ON 06/30/18 BY C. JESSUP, MLT.    Culture (A)  Final    GROUP A STREP (S.PYOGENES) ISOLATED SUSCEPTIBILITIES PERFORMED ON  PREVIOUS CULTURE WITHIN THE LAST 5 DAYS. Performed at [facility], [address], [place], [place] [address]    Report Status PENDING  Incomplete  Culture, sputum-assessment     Status: None   Collection Time: 07/01/18 12:55 PM  Result Value Ref Range Status   Specimen Description SPUTUM  Final   Special Requests NONE  Final   Sputum evaluation   Final    Sputum specimen not acceptable for testing.  Please recollect.   RESULT CALLED TO, READ BACK BY AND VERIFIED WITH: RN MC PHERSON ANDRE AT 1325 ON 4 1 2020 BY MHOUEGNIFIO Performed at [facility], [address], [place], [place] [address]    Report Status 07/01/2018 FINAL  Final     Labs: BNP (last 3 results) No results for input(s): BNP in the last 8760 hours. Basic Metabolic Panel: Recent Labs  Lab 06/30/18 0033 06/30/18 1843 07/02/18 0638  NA 132* 132* 137  K 3.7 4.0 3.8  CL 98 100 108  CO2 24 22 22   GLUCOSE 177* 154* 131*  BUN 16 15 18   CREATININE 1.69* 1.85* 1.48*  CALCIUM 9.2 8.4* 8.6*   Liver Function Tests: Recent Labs  Lab 06/30/18 0033 06/30/18 1843  AST 42* 31  ALT 42 33  ALKPHOS 48 46  BILITOT 1.0 1.2  PROT 7.5 6.8  ALBUMIN 3.5 2.9*   No results for input(s): LIPASE, AMYLASE in the last 168 hours. No results for input(s): AMMONIA in the last 168 hours. CBC: Recent Labs  Lab 06/30/18 0033 06/30/18 1715 07/01/18 0858 07/02/18 0638  WBC 14.1* 23.1* 20.6* 18.0*  NEUTROABS 11.0* 17.1* 15.9* 13.1*  HGB 12.1* 11.4* 10.8* 11.1*  HCT 37.1* 33.6* 32.4* 32.1*  MCV 84.7 85.1 84.8 83.8  PLT 217  214 205 249   Cardiac Enzymes: No results for input(s): CKTOTAL, CKMB, CKMBINDEX, TROPONINI in the last 168 hours. BNP: Invalid input(s): POCBNP CBG: Recent Labs  Lab 07/01/18 0826 07/01/18 1123 07/01/18 1708 07/01/18 2133 07/02/18 0856  GLUCAP 122* 133* 126* 109* 111*   D-Dimer No results for input(s): DDIMER in the last 72 hours. Hgb A1c No results for input(s): HGBA1C in the last 72 hours. Lipid Profile No results for input(s): CHOL, HDL, LDLCALC, TRIG, CHOLHDL, LDLDIRECT in the last 72 hours. Thyroid function studies No results for input(s): TSH, T4TOTAL, T3FREE, THYROIDAB in the last 72 hours.  Invalid input(s): FREET3 Anemia work up No results for input(s): VITAMINB12, FOLATE, FERRITIN, TIBC, IRON, RETICCTPCT in the last 72 hours. Urinalysis    Component Value Date/Time   COLORURINE AMBER (A) 06/30/2018 0410   APPEARANCEUR CLEAR 06/30/2018 0410   LABSPEC 1.021 06/30/2018 0410   PHURINE 5.0 06/30/2018 0410   GLUCOSEU NEGATIVE 06/30/2018 0410   HGBUR NEGATIVE 06/30/2018 0410   BILIRUBINUR NEGATIVE 06/30/2018 0410   KETONESUR NEGATIVE 06/30/2018 0410   PROTEINUR 100 (A) 06/30/2018 0410   NITRITE NEGATIVE 06/30/2018 0410   LEUKOCYTESUR NEGATIVE 06/30/2018 0410   Sepsis Labs Invalid input(s): PROCALCITONIN,  WBC,  LACTICIDVEN Microbiology Recent Results (from the past 240 hour(s))  Culture, blood (Routine x 2)     Status: Abnormal (Preliminary result)   Collection Time: 06/30/18 12:33 AM  Result Value Ref Range Status   Specimen Description BLOOD RIGHT ANTECUBITAL  Final   Special Requests   Final    BOTTLES DRAWN AEROBIC AND ANAEROBIC Blood Culture adequate volume   Culture  Setup Time   Final    GRAM POSITIVE COCCI IN CHAINS IN  BOTH AEROBIC AND ANAEROBIC BOTTLES CRITICAL RESULT CALLED TO, READ BACK BY AND VERIFIED WITH: S. COBLE, RN (MCHP) AT 1558 ON 06/30/18 BY C. JESSUP, MLT. Performed at [facility], [address] [place], [place] [address]     Culture STREPTOCOCCUS PYOGENES (A)  Final   Report Status PENDING  Incomplete   Organism ID, Bacteria STREPTOCOCCUS PYOGENES  Final      Susceptibility   Streptococcus pyogenes - MIC*    PENICILLIN <=0.06 SENSITIVE Sensitive     CEFTRIAXONE <=0.12 SENSITIVE Sensitive     ERYTHROMYCIN <=0.12 SENSITIVE Sensitive     LEVOFLOXACIN 0.5 SENSITIVE Sensitive     VANCOMYCIN 0.5 SENSITIVE Sensitive     * STREPTOCOCCUS PYOGENES  Blood Culture ID Panel (Reflexed)     Status: Abnormal   Collection Time: 06/30/18 12:33 AM  Result Value Ref Range Status   Enterococcus species NOT DETECTED NOT DETECTED Final   Listeria monocytogenes NOT DETECTED NOT DETECTED Final   Staphylococcus species NOT DETECTED NOT DETECTED Final   Staphylococcus aureus (BCID) NOT DETECTED NOT DETECTED Final   Streptococcus species DETECTED (A) NOT DETECTED Final    Comment: CRITICAL RESULT CALLED TO, READ BACK BY AND VERIFIED WITH: S. COBLE, RN (MCHP) AT 1558 ON 06/30/18 BY C. JESSUP, MLT.    Streptococcus agalactiae NOT DETECTED NOT DETECTED Final   Streptococcus pneumoniae NOT DETECTED NOT DETECTED Final   Streptococcus pyogenes DETECTED (A) NOT DETECTED Final    Comment: CRITICAL RESULT CALLED TO, READ BACK BY AND VERIFIED WITH: S. COBLE, RN (MCHP) AT 1558 ON 06/30/18 BY C. JESSUP, MLT.    Acinetobacter baumannii NOT DETECTED NOT DETECTED Final   Enterobacteriaceae species NOT DETECTED NOT DETECTED Final   Enterobacter cloacae complex NOT DETECTED NOT DETECTED Final   Escherichia coli NOT DETECTED NOT DETECTED Final   Klebsiella oxytoca NOT DETECTED NOT DETECTED Final   Klebsiella pneumoniae NOT DETECTED NOT DETECTED Final   Proteus species NOT DETECTED NOT DETECTED Final   Serratia marcescens NOT DETECTED NOT DETECTED Final   Haemophilus influenzae NOT DETECTED NOT DETECTED Final   Neisseria meningitidis NOT DETECTED NOT DETECTED Final   Pseudomonas aeruginosa NOT DETECTED NOT DETECTED Final   Candida albicans NOT  DETECTED NOT DETECTED Final   Candida glabrata NOT DETECTED NOT DETECTED Final   Candida krusei NOT DETECTED NOT DETECTED Final   Candida parapsilosis NOT DETECTED NOT DETECTED Final   Candida tropicalis NOT DETECTED NOT DETECTED Final    Comment: Performed at [facility], [address], [place], [place] [address]  Respiratory Panel by PCR     Status: None   Collection Time: 06/30/18  1:25 AM  Result Value Ref Range Status   Adenovirus NOT DETECTED NOT DETECTED Final   Coronavirus 229E NOT DETECTED NOT DETECTED Final    Comment: (NOTE) The Coronavirus on the Respiratory Panel, DOES NOT test for the novel  Coronavirus (2019 nCoV)    Coronavirus HKU1 NOT DETECTED NOT DETECTED Final   Coronavirus NL63 NOT DETECTED NOT DETECTED Final   Coronavirus OC43 NOT DETECTED NOT DETECTED Final   Metapneumovirus NOT DETECTED NOT DETECTED Final   Rhinovirus / Enterovirus NOT DETECTED NOT DETECTED Final   Influenza A NOT DETECTED NOT DETECTED Final   Influenza B NOT DETECTED NOT DETECTED Final   Parainfluenza Virus 1 NOT DETECTED NOT DETECTED Final   Parainfluenza Virus 2 NOT DETECTED NOT DETECTED Final   Parainfluenza Virus 3 NOT DETECTED NOT DETECTED Final   Parainfluenza Virus  4 NOT DETECTED NOT DETECTED Final   Respiratory Syncytial Virus NOT DETECTED NOT DETECTED Final   Bordetella pertussis NOT DETECTED NOT DETECTED Final   Chlamydophila pneumoniae NOT DETECTED NOT DETECTED Final   Mycoplasma pneumoniae NOT DETECTED NOT DETECTED Final    Comment: Performed at [facility], [address], [place], [place] [address]  Culture, blood (Routine x 2)     Status: Abnormal (Preliminary result)   Collection Time: 06/30/18  2:06 AM  Result Value Ref Range Status   Specimen Description BLOOD LEFT HAND  Final   Special Requests   Final    BOTTLES DRAWN AEROBIC AND ANAEROBIC Blood Culture adequate volume   Culture  Setup Time   Final    GRAM POSITIVE COCCI IN CHAINS IN BOTH AEROBIC AND  ANAEROBIC BOTTLES CRITICAL RESULT CALLED TO, READ BACK BY AND VERIFIED WITH: S. COBLE, RN (MCHP) AT 1558 ON 06/30/18 BY C. JESSUP, MLT.    Culture (A)  Final    GROUP A STREP (S.PYOGENES) ISOLATED SUSCEPTIBILITIES PERFORMED ON PREVIOUS CULTURE WITHIN THE LAST 5 DAYS. Performed at [facility], [address], [place], [place] [address]    Report Status PENDING  Incomplete  Culture, sputum-assessment     Status: None   Collection Time: 07/01/18 12:55 PM  Result Value Ref Range Status   Specimen Description SPUTUM  Final   Special Requests NONE  Final   Sputum evaluation   Final    Sputum specimen not acceptable for testing.  Please recollect.   RESULT CALLED TO, READ BACK BY AND VERIFIED WITH: RN MC PHERSON ANDRE AT 1325 ON 4 1 2020 BY MHOUEGNIFIO Performed at [facility], [address] [place], [place] [address]    Report Status 07/01/2018 FINAL  Final     Time coordinating discharge: Over 30 minutes  SIGNED:   Burke Keels, MD  [facility] 07/02/2018, 11:11 AM Pager   If 7PM-7AM, please contact night-coverage [email or web address] Password [email or web address]

## 2018-07-02 NOTE — Clinical Social Work Note (Signed)
Pt's PCP is Dr Gerlene Fee at [facility]. Per Clinic pt is to call after d/c in order to set up follow up appointment. Clinic is doing telephone appointments at this time. Pt's spouse assist in transportation and picking up medications. No addition resources needed at this time. Pt d/c today.  [place], [place] [phone]

## 2018-07-03 ENCOUNTER — Telehealth: Payer: Self-pay | Admitting: *Deleted

## 2018-07-20 ENCOUNTER — Other Ambulatory Visit: Payer: Self-pay

## 2018-07-22 ENCOUNTER — Telehealth: Payer: Self-pay | Admitting: Adult Health

## 2018-07-22 NOTE — Telephone Encounter (Signed)
Pt's wife called on his behalf to inquire about results of the COVID 19 test.  The pt. was present, and gave nurse permission to speak with his wife re: the lab results.   Noted the results are back, but have not been signed off on by provider.  The pt. has an appt. to establish care with Shirline Frees on 07/29/18.  Advised pt's. wife will need to defer releasing results, until provider has reviewed and signed the lab results.  Verb. Understanding.   Call placed to [facility].  Spoke with [place], and she will have provider review results and will notify patient and wife of final result on COVID 19 test.  Advised wife to expect call back re: this information.  Wife agreed with plan.

## 2018-07-22 NOTE — Telephone Encounter (Signed)
Tried to reach the pt by telephone.  No answer or machine.  Will try again at a later time. 

## 2018-07-22 NOTE — Telephone Encounter (Signed)
Spoke to the pt.  Advised him that no results have been entered at this time for COVID19.  Instructed him to reach out to [facility].  Nothing further needed at this time.

## 2018-07-29 ENCOUNTER — Ambulatory Visit (INDEPENDENT_AMBULATORY_CARE_PROVIDER_SITE_OTHER): Payer: Medicare Other | Admitting: Adult Health

## 2018-07-29 ENCOUNTER — Encounter: Payer: Self-pay | Admitting: Adult Health

## 2018-07-29 ENCOUNTER — Other Ambulatory Visit: Payer: Self-pay

## 2018-07-29 DIAGNOSIS — G4700 Insomnia, unspecified: Secondary | ICD-10-CM

## 2018-07-29 DIAGNOSIS — Z7689 Persons encountering health services in other specified circumstances: Secondary | ICD-10-CM | POA: Diagnosis not present

## 2018-07-29 DIAGNOSIS — E1122 Type 2 diabetes mellitus with diabetic chronic kidney disease: Secondary | ICD-10-CM | POA: Diagnosis not present

## 2018-07-29 DIAGNOSIS — N183 Chronic kidney disease, stage 3 (moderate): Secondary | ICD-10-CM

## 2018-07-29 DIAGNOSIS — E785 Hyperlipidemia, unspecified: Secondary | ICD-10-CM

## 2018-07-29 DIAGNOSIS — I10 Essential (primary) hypertension: Secondary | ICD-10-CM

## 2018-07-29 NOTE — Progress Notes (Signed)
Virtual Visit via Video Note  I connected withJames L Liu on 07/29/18 at  9:30 AM EDT by a video enabled telemedicine application and verified that I am speaking with the correct person using two identifiers.  Location patient: home Location provider:work or home office Persons participating in the virtual visit: patient, provider  I discussed the limitations of evaluation and management by telemedicine and the availability of in person appointments. The patient expressed understanding and agreed to proceed.  Patient presents to clinic today to establish care. He receives most of his care at the [place]  Acute Concerns: Establish Care   Chronic Issues: DM- Takes Actos 30 mg daily[facility] He does not know what is last A1c was.   Essential Hypertension - Takes Losartan 100 mg daily. Is managed by the [place]. Denies chest pain or shortness of breath  BP Readings from Last 3 Encounters:  07/02/18 135/84  06/30/18 (!) 141/93  05/24/16 109/83   Depression/Anxiety - Leanora Ivanoff Wellbutrin 100 mg BID. Is seen by psychiatry at the [place]. He feels well controlled on this   Insomnia - Takes Ambien 10 mg PRN. Is filled at the [place].   Night terrors - [facility]. Using sparingly.   H/o Glaucoma - uses Timoptic, Simbrinza, and Xalatan   Hyperlipidemia - Takes Atorvastatin 80 mg daily.    Health Maintenance: Dental -- Routine Care Vision -- Routine Care  Immunizations -- UTD  Colonoscopy -- 2016?  Past Medical History:  Diagnosis Date  . Diabetes mellitus without complication (HCC)   . High cholesterol   . Hypertension   . Obesity     Past Surgical History:  Procedure Laterality Date  . REPLACEMENT TOTAL KNEE BILATERAL      Current Outpatient Medications on File Prior to Visit  Medication Sig Dispense Refill  . atorvastatin (LIPITOR) 80 MG tablet Take 80 mg by mouth daily.    . pioglitazone (ACTOS) 30 MG tablet Take 30 mg by mouth daily.    Marland Kitchen  amoxicillin (AMOXIL) 500 MG capsule Take 2,000 mg by mouth See admin instructions. Take 2,000 mg by mouth one hour prior to dental procedures    . Brinzolamide-Brimonidine (SIMBRINZA) 1-0.2 % SUSP Place 1 drop into both eyes 3 (three) times daily.    Marland Kitchen buPROPion (WELLBUTRIN SR) 100 MG 12 hr tablet Take 100 mg by mouth 2 (two) times daily. MORNING and NOON    . latanoprost (XALATAN) 0.005 % ophthalmic solution Place 1 drop into both eyes at bedtime.    Marland Kitchen losartan (COZAAR) 100 MG tablet Take 100 mg by mouth daily.    . meloxicam (MOBIC) 15 MG tablet Take 15 mg by mouth daily as needed (for knee pain).     . prazosin (MINIPRESS) 2 MG capsule Take 2 mg by mouth at bedtime as needed (for nightmares).    . timolol (TIMOPTIC-XR) 0.5 % ophthalmic gel-forming Place 1 drop into both eyes every morning.    . zolpidem (AMBIEN) 10 MG tablet Take 10 mg by mouth at bedtime.     No current facility-administered medications on file prior to visit.     No Known Allergies  Family History  Problem Relation Age of Onset  . Stroke Mother   . Stroke Father     Social History   Socioeconomic History  . Marital status: Married    Spouse name: Not on file  . Number of children: Not on file  . Years of  education: Not on file  . Highest education level: Not on file  Occupational History  . Not on file  Social Needs  . Financial resource strain: Not on file  . Food insecurity:    Worry: Not on file    Inability: Not on file  . Transportation needs:    Medical: Not on file    Non-medical: Not on file  Tobacco Use  . Smoking status: Former Games developer  . Smokeless tobacco: Never Used  Substance and Sexual Activity  . Alcohol use: Yes    Comment: occ  . Drug use: No  . Sexual activity: Not on file  Lifestyle  . Physical activity:    Days per week: Not on file    Minutes per session: Not on file  . Stress: Not on file  Relationships  . Social connections:    Talks on phone: Not on file    Gets  together: Not on file    Attends religious service: Not on file    Active member of club or organization: Not on file    Attends meetings of clubs or organizations: Not on file    Relationship status: Not on file  . Intimate partner violence:    Fear of current or ex partner: Not on file    Emotionally abused: Not on file    Physically abused: Not on file    Forced sexual activity: Not on file  Other Topics Concern  . Not on file  Social History Narrative  . Not on file    ROS  There were no vitals taken for this visit.  Physical Exam VITALS per patient if applicable:  GENERAL: alert, oriented, appears well and in no acute distress  HEENT: atraumatic, conjunttiva clear, no obvious abnormalities on inspection of external nose and ears  NECK: normal movements of the head and neck  LUNGS: on inspection no signs of respiratory distress, breathing rate appears normal, no obvious gross SOB, gasping or wheezing  CV: no obvious cyanosis  MS: moves all visible extremities without noticeable abnormality  PSYCH/NEURO: pleasant and cooperative, no obvious depression or anxiety, speech and thought processing grossly intact  Recent Results (from the past 2160 hour(s))  Comprehensive metabolic panel     Status: Abnormal   Collection Time: 06/30/18 12:33 AM  Result Value Ref Range   Sodium 132 (L) 135 - 145 mmol/L   Potassium 3.7 3.5 - 5.1 mmol/L   Chloride 98 98 - 111 mmol/L   CO2 24 22 - 32 mmol/L   Glucose, Bld 177 (H) 70 - 99 mg/dL   BUN 16 8 - 23 mg/dL   Creatinine, Ser 1.61 (H) 0.61 - 1.24 mg/dL   Calcium 9.2 8.9 - 09.6 mg/dL   Total Protein 7.5 6.5 - 8.1 g/dL   Albumin 3.5 3.5 - 5.0 g/dL   AST 42 (H) 15 - 41 U/L   ALT 42 0 - 44 U/L   Alkaline Phosphatase 48 38 - 126 U/L   Total Bilirubin 1.0 0.3 - 1.2 mg/dL   GFR calc non Af Amer 39 (L) >60 mL/min   GFR calc Af Amer 45 (L) >60 mL/min   Anion gap 10 5 - 15    Comment: Performed at [facility], [address], [place], [place] [address]  Lactic acid, plasma     Status: None   Collection Time: 06/30/18 12:33 AM  Result Value Ref Range   Lactic Acid, Venous 1.4 0.5 - 1.9 mmol/L  Comment: Performed at [facility], [address] [place], [place] [address]  CBC with Differential     Status: Abnormal   Collection Time: 06/30/18 12:33 AM  Result Value Ref Range   WBC 14.1 (H) 4.0 - 10.5 K/uL   RBC 4.38 4.22 - 5.81 MIL/uL   Hemoglobin 12.1 (L) 13.0 - 17.0 g/dL   HCT 60.437.1 (L) 54.039.0 - 98.152.0 %   MCV 84.7 80.0 - 100.0 fL   MCH 27.6 26.0 - 34.0 pg   MCHC 32.6 30.0 - 36.0 g/dL   RDW 19.113.3 47.811.5 - 29.515.5 %   Platelets 217 150 - 400 K/uL   nRBC 0.0 0.0 - 0.2 %   Neutrophils Relative % 78 %   Neutro Abs 11.0 (H) 1.7 - 7.7 K/uL   Lymphocytes Relative 14 %   Lymphs Abs 1.9 0.7 - 4.0 K/uL   Monocytes Relative 7 %   Monocytes Absolute 1.1 (H) 0.1 - 1.0 K/uL   Eosinophils Relative 0 %   Eosinophils Absolute 0.0 0.0 - 0.5 K/uL   Basophils Relative 0 %   Basophils Absolute 0.1 0.0 - 0.1 K/uL   Immature Granulocytes 1 %   Abs Immature Granulocytes 0.12 (H) 0.00 - 0.07 K/uL    Comment: Performed at [facility], [address] [place], [place] [address]  Protime-INR     Status: Abnormal   Collection Time: 06/30/18 12:33 AM  Result Value Ref Range   Prothrombin Time 15.8 (H) 11.4 - 15.2 seconds   INR 1.3 (H) 0.8 - 1.2    Comment: (NOTE) INR goal varies based on device and disease states. Performed at [facility], [address] [place], [place] [address]   Culture, blood (Routine x 2)     Status: Abnormal   Collection Time: 06/30/18 12:33 AM  Result Value Ref Range   Specimen Description BLOOD RIGHT ANTECUBITAL    Special Requests      BOTTLES DRAWN AEROBIC AND ANAEROBIC Blood Culture adequate volume   Culture  Setup Time      GRAM POSITIVE COCCI IN CHAINS IN BOTH AEROBIC AND ANAEROBIC BOTTLES CRITICAL RESULT CALLED TO, READ BACK BY AND VERIFIED WITH: S. COBLE, RN (MCHP) AT 1558  ON 06/30/18 BY C. JESSUP, MLT. Performed at [facility]  [facility], [address] [place], [place] [address]    Culture STREPTOCOCCUS PYOGENES (A)    Report Status 07/02/2018 FINAL    Organism ID, Bacteria STREPTOCOCCUS PYOGENES       Susceptibility   Streptococcus pyogenes - MIC*    PENICILLIN <=0.06 SENSITIVE Sensitive     CEFTRIAXONE <=0.12 SENSITIVE Sensitive     ERYTHROMYCIN <=0.12 SENSITIVE Sensitive     LEVOFLOXACIN 0.5 SENSITIVE Sensitive     VANCOMYCIN 0.5 SENSITIVE Sensitive     * STREPTOCOCCUS PYOGENES  Blood Culture ID Panel (Reflexed)     Status: Abnormal   Collection Time: 06/30/18 12:33 AM  Result Value Ref Range   Enterococcus species NOT DETECTED NOT DETECTED   Listeria monocytogenes NOT DETECTED NOT DETECTED   Staphylococcus species NOT DETECTED NOT DETECTED   Staphylococcus aureus (BCID) NOT DETECTED NOT DETECTED   Streptococcus species DETECTED (A) NOT DETECTED    Comment: CRITICAL RESULT CALLED TO, READ BACK BY AND VERIFIED WITH: S. COBLE, RN (MCHP) AT 1558 ON 06/30/18 BY C. JESSUP, MLT.    Streptococcus agalactiae NOT DETECTED NOT DETECTED   Streptococcus pneumoniae NOT DETECTED NOT DETECTED   Streptococcus pyogenes DETECTED (A)  NOT DETECTED    Comment: CRITICAL RESULT CALLED TO, READ BACK BY AND VERIFIED WITH: S. COBLE, RN (MCHP) AT 1558 ON 06/30/18 BY C. JESSUP, MLT.    Acinetobacter baumannii NOT DETECTED NOT DETECTED   Enterobacteriaceae species NOT DETECTED NOT DETECTED   Enterobacter cloacae complex NOT DETECTED NOT DETECTED   Escherichia coli NOT DETECTED NOT DETECTED   Klebsiella oxytoca NOT DETECTED NOT DETECTED   Klebsiella pneumoniae NOT DETECTED NOT DETECTED   Proteus species NOT DETECTED NOT DETECTED   Serratia marcescens NOT DETECTED NOT DETECTED   Haemophilus influenzae NOT DETECTED NOT DETECTED   Neisseria meningitidis NOT DETECTED NOT DETECTED   Pseudomonas aeruginosa NOT DETECTED NOT DETECTED   Candida albicans NOT DETECTED NOT DETECTED    Candida glabrata NOT DETECTED NOT DETECTED   Candida krusei NOT DETECTED NOT DETECTED   Candida parapsilosis NOT DETECTED NOT DETECTED   Candida tropicalis NOT DETECTED NOT DETECTED    Comment: Performed at [facility], [address], [place], [place] [address]  Influenza panel by PCR (type A & B)     Status: None   Collection Time: 06/30/18  1:25 AM  Result Value Ref Range   Influenza A By PCR NEGATIVE NEGATIVE   Influenza B By PCR NEGATIVE NEGATIVE    Comment: (NOTE) The Xpert Xpress Flu assay is intended as an aid in the diagnosis of  influenza and should not be used as a sole basis for treatment.  This  assay is FDA approved for nasopharyngeal swab specimens only. Nasal  washings and aspirates are unacceptable for Xpert Xpress Flu testing. Performed at [facility], [address] [place], [place] [address]   Respiratory Panel by PCR     Status: None   Collection Time: 06/30/18  1:25 AM  Result Value Ref Range   Adenovirus NOT DETECTED NOT DETECTED   Coronavirus 229E NOT DETECTED NOT DETECTED    Comment: (NOTE) The Coronavirus on the Respiratory Panel, DOES NOT test for the novel  Coronavirus (2019 nCoV)    Coronavirus HKU1 NOT DETECTED NOT DETECTED   Coronavirus NL63 NOT DETECTED NOT DETECTED   Coronavirus OC43 NOT DETECTED NOT DETECTED   Metapneumovirus NOT DETECTED NOT DETECTED   Rhinovirus / Enterovirus NOT DETECTED NOT DETECTED   Influenza A NOT DETECTED NOT DETECTED   Influenza B NOT DETECTED NOT DETECTED   Parainfluenza Virus 1 NOT DETECTED NOT DETECTED   Parainfluenza Virus 2 NOT DETECTED NOT DETECTED   Parainfluenza Virus 3 NOT DETECTED NOT DETECTED   Parainfluenza Virus 4 NOT DETECTED NOT DETECTED   Respiratory Syncytial Virus NOT DETECTED NOT DETECTED   Bordetella pertussis NOT DETECTED NOT DETECTED   Chlamydophila pneumoniae NOT DETECTED NOT DETECTED   Mycoplasma pneumoniae NOT DETECTED NOT DETECTED    Comment: Performed at [facility], [address] [place], [place] [address]  Lactic acid, plasma     Status: None   Collection Time: 06/30/18  1:51 AM  Result Value Ref Range   Lactic Acid, Venous 1.7 0.5 - 1.9 mmol/L    Comment: Performed at [facility], [address], [place], [place] [address]  Culture, blood (Routine x 2)     Status: Abnormal   Collection Time: 06/30/18  2:06 AM  Result Value Ref Range   Specimen Description BLOOD LEFT HAND    Special Requests      BOTTLES DRAWN AEROBIC AND ANAEROBIC Blood Culture adequate volume   Culture  Setup  Time      GRAM POSITIVE COCCI IN CHAINS IN BOTH AEROBIC AND ANAEROBIC BOTTLES CRITICAL RESULT CALLED TO, READ BACK BY AND VERIFIED WITH: S. COBLE, RN (MCHP) AT 1558 ON 06/30/18 BY C. JESSUP, MLT.    Culture (A)     GROUP A STREP (S.PYOGENES) ISOLATED SUSCEPTIBILITIES PERFORMED ON PREVIOUS CULTURE WITHIN THE LAST 5 DAYS. Performed at [facility], [address], [place], [place] [address]    Report Status 07/02/2018 FINAL   Urinalysis, Routine w reflex microscopic     Status: Abnormal   Collection Time: 06/30/18  4:10 AM  Result Value Ref Range   Color, Urine AMBER (A) YELLOW    Comment: BIOCHEMICALS MAY BE AFFECTED BY COLOR   APPearance CLEAR CLEAR   Specific Gravity, Urine 1.021 1.005 - 1.030   pH 5.0 5.0 - 8.0   Glucose, UA NEGATIVE NEGATIVE mg/dL   Hgb urine dipstick NEGATIVE NEGATIVE   Bilirubin Urine NEGATIVE NEGATIVE   Ketones, ur NEGATIVE NEGATIVE mg/dL   Protein, ur 604 (A) NEGATIVE mg/dL   Nitrite NEGATIVE NEGATIVE   Leukocytes,Ua NEGATIVE NEGATIVE   RBC / HPF 6-10 0 - 5 RBC/hpf   WBC, UA 0-5 0 - 5 WBC/hpf   Bacteria, UA RARE (A) NONE SEEN   Mucus PRESENT    Hyaline Casts, UA PRESENT     Comment: Performed at [facility], [address] [place], [place] [address]  CBC with Differential/Platelet     Status: Abnormal   Collection Time: 06/30/18  5:15 PM  Result Value Ref Range   WBC 23.1 (H) 4.0 - 10.5 K/uL   RBC 3.95 (L) 4.22  - 5.81 MIL/uL   Hemoglobin 11.4 (L) 13.0 - 17.0 g/dL   HCT 11.9 (L) 14.7 - 82.9 %   MCV 85.1 80.0 - 100.0 fL   MCH 28.9 26.0 - 34.0 pg   MCHC 33.9 30.0 - 36.0 g/dL   RDW 56.2 13.0 - 86.5 %   Platelets 214 150 - 400 K/uL   nRBC 0.0 0.0 - 0.2 %   Neutrophils Relative % 75 %   Neutro Abs 17.1 (H) 1.7 - 7.7 K/uL   Lymphocytes Relative 12 %   Lymphs Abs 2.9 0.7 - 4.0 K/uL   Monocytes Relative 11 %   Monocytes Absolute 2.6 (H) 0.1 - 1.0 K/uL   Eosinophils Relative 0 %   Eosinophils Absolute 0.0 0.0 - 0.5 K/uL   Basophils Relative 0 %   Basophils Absolute 0.1 0.0 - 0.1 K/uL   Immature Granulocytes 2 %   Abs Immature Granulocytes 0.46 (H) 0.00 - 0.07 K/uL    Comment: Performed at [facility], [address], [place], [place] [address]  Lactic acid, plasma     Status: None   Collection Time: 06/30/18  5:15 PM  Result Value Ref Range   Lactic Acid, Venous 1.7 0.5 - 1.9 mmol/L    Comment: Performed at [facility], [address], [place], [place] [address]  Comprehensive metabolic panel     Status: Abnormal   Collection Time: 06/30/18  6:43 PM  Result Value Ref Range   Sodium 132 (L) 135 - 145 mmol/L   Potassium 4.0 3.5 - 5.1 mmol/L   Chloride 100 98 - 111 mmol/L   CO2 22 22 - 32 mmol/L   Glucose, Bld 154 (H) 70 - 99 mg/dL   BUN 15 8 - 23 mg/dL   Creatinine, Ser 8.41 (H) 0.61 -  1.24 mg/dL   Calcium 8.4 (L) 8.9 - 10.3 mg/dL   Total Protein 6.8 6.5 - 8.1 g/dL   Albumin 2.9 (L) 3.5 - 5.0 g/dL   AST 31 15 - 41 U/L   ALT 33 0 - 44 U/L   Alkaline Phosphatase 46 38 - 126 U/L   Total Bilirubin 1.2 0.3 - 1.2 mg/dL   GFR calc non Af Amer 35 (L) >60 mL/min   GFR calc Af Amer 40 (L) >60 mL/min   Anion gap 10 5 - 15    Comment: Performed at [facility], [address] [place], [place] [address]  CBG monitoring, ED     Status: Abnormal   Collection Time: 06/30/18  9:55 PM  Result Value Ref Range   Glucose-Capillary 157 (H) 70 - 99 mg/dL  Lactic acid, plasma      Status: None   Collection Time: 06/30/18  9:56 PM  Result Value Ref Range   Lactic Acid, Venous 1.3 0.5 - 1.9 mmol/L    Comment: Performed at [facility], [address], [place], [place] [address]  Procalcitonin     Status: None   Collection Time: 06/30/18  9:56 PM  Result Value Ref Range   Procalcitonin 1.30 ng/mL    Comment:        Interpretation: PCT > 0.5 ng/mL and <= 2 ng/mL: Systemic infection (sepsis) is possible, but other conditions are known to elevate PCT as well. (NOTE)       Sepsis PCT Algorithm           Lower Respiratory Tract                                      Infection PCT Algorithm    ----------------------------     ----------------------------         PCT < 0.25 ng/mL                PCT < 0.10 ng/mL         Strongly encourage             Strongly discourage   discontinuation of antibiotics    initiation of antibiotics    ----------------------------     -----------------------------       PCT 0.25 - 0.50 ng/mL            PCT 0.10 - 0.25 ng/mL               OR       >80% decrease in PCT            Discourage initiation of                                            antibiotics      Encourage discontinuation           of antibiotics    ----------------------------     -----------------------------         PCT >= 0.50 ng/mL              PCT 0.26 - 0.50 ng/mL                AND       <80% decrease in PCT  Encourage initiation of                                             antibiotics       Encourage continuation           of antibiotics    ----------------------------     -----------------------------        PCT >= 0.50 ng/mL                  PCT > 0.50 ng/mL               AND         increase in PCT                  Strongly encourage                                      initiation of antibiotics    Strongly encourage escalation           of antibiotics                                     -----------------------------                                            PCT <= 0.25 ng/mL                                                 OR                                        > 80% decrease in PCT                                     Discontinue / Do not initiate                                             antibiotics Performed at [facility], [address] [place], [place] [address]   HIV antibody (Routine Screening)     Status: None   Collection Time: 06/30/18  9:56 PM  Result Value Ref Range   HIV Screen 4th Generation wRfx Non Reactive Non Reactive    Comment: (NOTE) Performed At: [facility] [address] [place], [place] [id number] Jolene Schimke MD [id number]   Strep pneumoniae urinary antigen     Status: None   Collection Time: 06/30/18 11:14 PM  Result Value Ref Range   Strep Pneumo Urinary Antigen NEGATIVE NEGATIVE    Comment: Performed at [facility], [address] [place], [place] [address]  Legionella Pneumophila Serogp 1 Ur Ag  Status: None   Collection Time: 06/30/18 11:14 PM  Result Value Ref Range   L. pneumophila Serogp 1 Ur Ag Negative Negative    Comment: (NOTE) Presumptive negative for L. pneumophila serogroup 1 antigen in urine, suggesting no recent or current infection. Legionnaires' disease cannot be ruled out since other serogroups and species may also cause disease. Performed At: [facility] [address] [place], [place] [id number] Jolene Schimke MD WU:[id number]    Source of Sample URINE, RANDOM     Comment: Performed at [facility], [address] [place], [place] [address]  Glucose, capillary     Status: Abnormal   Collection Time: 07/01/18  8:26 AM  Result Value Ref Range   Glucose-Capillary 122 (H) 70 - 99 mg/dL  CBC with Differential/Platelet     Status: Abnormal   Collection Time: 07/01/18  8:58 AM  Result Value Ref Range   WBC 20.6 (H) 4.0 - 10.5 K/uL   RBC 3.82 (L) 4.22 - 5.81 MIL/uL   Hemoglobin 10.8 (L) 13.0 - 17.0 g/dL    HCT 30.8 (L) 65.7 - 52.0 %   MCV 84.8 80.0 - 100.0 fL   MCH 28.3 26.0 - 34.0 pg   MCHC 33.3 30.0 - 36.0 g/dL   RDW 84.6 96.2 - 95.2 %   Platelets 205 150 - 400 K/uL   nRBC 0.0 0.0 - 0.2 %   Neutrophils Relative % 76 %   Neutro Abs 15.9 (H) 1.7 - 7.7 K/uL   Lymphocytes Relative 12 %   Lymphs Abs 2.4 0.7 - 4.0 K/uL   Monocytes Relative 9 %   Monocytes Absolute 1.8 (H) 0.1 - 1.0 K/uL   Eosinophils Relative 0 %   Eosinophils Absolute 0.0 0.0 - 0.5 K/uL   Basophils Relative 0 %   Basophils Absolute 0.1 0.0 - 0.1 K/uL   Immature Granulocytes 3 %   Abs Immature Granulocytes 0.51 (H) 0.00 - 0.07 K/uL    Comment: Performed at [facility], [address] [place], [place] [address]  Glucose, capillary     Status: Abnormal   Collection Time: 07/01/18 11:23 AM  Result Value Ref Range   Glucose-Capillary 133 (H) 70 - 99 mg/dL  Culture, sputum-assessment     Status: None   Collection Time: 07/01/18 12:55 PM  Result Value Ref Range   Specimen Description SPUTUM    Special Requests NONE    Sputum evaluation      Sputum specimen not acceptable for testing.  Please recollect.   RESULT CALLED TO, READ BACK BY AND VERIFIED WITH: RN MC PHERSON ANDRE AT 1325 ON 4 1 2020 BY MHOUEGNIFIO Performed at [facility], [address] [place], [place] [address]    Report Status 07/01/2018 FINAL   Glucose, capillary     Status: Abnormal   Collection Time: 07/01/18  5:08 PM  Result Value Ref Range   Glucose-Capillary 126 (H) 70 - 99 mg/dL  Glucose, capillary     Status: Abnormal   Collection Time: 07/01/18  9:33 PM  Result Value Ref Range   Glucose-Capillary 109 (H) 70 - 99 mg/dL  CBC with Differential/Platelet     Status: Abnormal   Collection Time: 07/02/18  6:38 AM  Result Value Ref Range   WBC 18.0 (H) 4.0 - 10.5 K/uL   RBC 3.83 (L) 4.22 - 5.81 MIL/uL   Hemoglobin 11.1 (L) 13.0 - 17.0 g/dL   HCT 27.2 (L) 53.6 - 64.4 %  MCV 83.8 80.0 - 100.0 fL   MCH 29.0 26.0 - 34.0 pg   MCHC  34.6 30.0 - 36.0 g/dL   RDW 16.1 09.6 - 04.5 %   Platelets 249 150 - 400 K/uL   nRBC 0.0 0.0 - 0.2 %   Neutrophils Relative % 72 %   Neutro Abs 13.1 (H) 1.7 - 7.7 K/uL   Lymphocytes Relative 14 %   Lymphs Abs 2.6 0.7 - 4.0 K/uL   Monocytes Relative 8 %   Monocytes Absolute 1.4 (H) 0.1 - 1.0 K/uL   Eosinophils Relative 1 %   Eosinophils Absolute 0.1 0.0 - 0.5 K/uL   Basophils Relative 1 %   Basophils Absolute 0.1 0.0 - 0.1 K/uL   Immature Granulocytes 4 %   Abs Immature Granulocytes 0.75 (H) 0.00 - 0.07 K/uL    Comment: Performed at [facility], [address] [place], [place] [address]  Basic metabolic panel     Status: Abnormal   Collection Time: 07/02/18  6:38 AM  Result Value Ref Range   Sodium 137 135 - 145 mmol/L   Potassium 3.8 3.5 - 5.1 mmol/L   Chloride 108 98 - 111 mmol/L   CO2 22 22 - 32 mmol/L   Glucose, Bld 131 (H) 70 - 99 mg/dL   BUN 18 8 - 23 mg/dL   Creatinine, Ser 1.91 (H) 0.61 - 1.24 mg/dL   Calcium 8.6 (L) 8.9 - 10.3 mg/dL   GFR calc non Af Amer 45 (L) >60 mL/min   GFR calc Af Amer 53 (L) >60 mL/min   Anion gap 7 5 - 15    Comment: Performed at [facility], [address], [place], [place] [address]  Glucose, capillary     Status: Abnormal   Collection Time: 07/02/18  8:56 AM  Result Value Ref Range   Glucose-Capillary 111 (H) 70 - 99 mg/dL  Glucose, capillary     Status: Abnormal   Collection Time: 07/02/18 12:05 PM  Result Value Ref Range   Glucose-Capillary 119 (H) 70 - 99 mg/dL    Assessment/Plan:  Discussed the following assessment and plan:  All of his medications are managed and filled at the [place].  He would like to use this primary care office as a as needed basis in case he becomes ill.  He was advised to continue with his current medications that were prescribed at the [place].  I would be happy to take over care for him but he does not want that at this time.  Courage heart healthy diet and frequent exercise.  He has no acute  complaints today  Essential hypertension  Encounter to establish care  Type 2 diabetes mellitus with stage 3 chronic kidney disease, without long-term current use of insulin (HCC)  Insomnia, unspecified type  Hyperlipidemia, unspecified hyperlipidemia type     I discussed the assessment and treatment plan with the patient. The patient was provided an opportunity to ask questions and all were answered. The patient agreed with the plan and demonstrated an understanding of the instructions.   The patient was advised to call back or seek an in-person evaluation if the symptoms worsen or if the condition fails to improve as anticipated.   Shirline Frees, NP

## 2020-05-03 ENCOUNTER — Telehealth: Payer: Self-pay | Admitting: Adult Health

## 2020-05-03 NOTE — Telephone Encounter (Signed)
Spoke with patient to  schedule Medicare Annual Wellness Visit (AWV) either virtually or in office.  He is going out of town and will call back   Last AWV no information please schedule at anytime with [facility] Nurse Health Advisor 1 or 2   This should be a 45 minute visit. Patient also need appointment with pcp last appointment 07/29/2018

## 2020-06-09 ENCOUNTER — Telehealth: Payer: Self-pay | Admitting: Adult Health

## 2020-06-09 NOTE — Telephone Encounter (Signed)
Left message for patient to call back and schedule Medicare Annual Wellness Visit (AWV) either virtually or in office. No detailed message left   Last AWV  No information  please schedule at anytime with [facility] Nurse Health Advisor 1 or 2   This should be a 45 minute visit.  Patient also needs appointment with PCP last appointment 07/29/2018

## 2020-09-04 ENCOUNTER — Telehealth: Payer: Self-pay | Admitting: Adult Health

## 2020-09-04 NOTE — Telephone Encounter (Signed)
Left message for patient to call back and schedule Medicare Annual Wellness Visit (AWV) either virtually or in office.   AWV-I per PALMET[place] 04/01/09 please schedule at anytime with [facility] Nurse Health Advisor 1 or 2   This should be a 45 minute visit. 

## 2020-09-04 NOTE — Telephone Encounter (Signed)
Patient also needs appointment with PCP last appointment 07/29/2018

## 2020-10-13 ENCOUNTER — Telehealth: Payer: Self-pay | Admitting: Adult Health

## 2020-10-13 NOTE — Telephone Encounter (Signed)
Left message for patient to call back and schedule Medicare Annual Wellness Visit (AWV) either virtually or in office.  AWV-I per PALMETTO 04/01/09  please schedule at anytime with [facility] Nurse Health Advisor 1 or 2  Patient also needs appointment with pcp last appointment 07/29/2018  This should be a 45 minute visit.

## 2022-06-27 ENCOUNTER — Other Ambulatory Visit: Payer: Self-pay | Admitting: Nephrology

## 2022-06-27 DIAGNOSIS — N1831 Chronic kidney disease, stage 3a: Secondary | ICD-10-CM

## 2022-07-15 ENCOUNTER — Inpatient Hospital Stay: Admission: RE | Admit: 2022-07-15 | Payer: Medicare Other | Source: Ambulatory Visit

## 2022-07-19 ENCOUNTER — Ambulatory Visit
Admission: RE | Admit: 2022-07-19 | Discharge: 2022-07-19 | Disposition: A | Discharge status: Home / Self Care | Payer: Non-veteran care | Source: Ambulatory Visit | Attending: Nephrology

## 2022-07-19 DIAGNOSIS — N1831 Chronic kidney disease, stage 3a: Secondary | ICD-10-CM

## 2023-04-10 ENCOUNTER — Emergency Department (HOSPITAL_COMMUNITY): Payer: No Typology Code available for payment source

## 2023-04-10 ENCOUNTER — Other Ambulatory Visit: Payer: Self-pay

## 2023-04-10 ENCOUNTER — Emergency Department (HOSPITAL_COMMUNITY)
Admission: EM | Admit: 2023-04-10 | Discharge: 2023-04-10 | Disposition: A | Discharge status: Home / Self Care | Payer: No Typology Code available for payment source | Attending: Emergency Medicine | Admitting: Emergency Medicine

## 2023-04-10 ENCOUNTER — Encounter (HOSPITAL_COMMUNITY): Payer: Self-pay | Admitting: *Deleted

## 2023-04-10 DIAGNOSIS — Z7982 Long term (current) use of aspirin: Secondary | ICD-10-CM | POA: Insufficient documentation

## 2023-04-10 DIAGNOSIS — R0602 Shortness of breath: Secondary | ICD-10-CM | POA: Diagnosis not present

## 2023-04-10 DIAGNOSIS — R079 Chest pain, unspecified: Secondary | ICD-10-CM | POA: Insufficient documentation

## 2023-04-10 DIAGNOSIS — R55 Syncope and collapse: Secondary | ICD-10-CM | POA: Insufficient documentation

## 2023-04-10 DIAGNOSIS — R410 Disorientation, unspecified: Secondary | ICD-10-CM | POA: Insufficient documentation

## 2023-04-10 DIAGNOSIS — R1110 Vomiting, unspecified: Secondary | ICD-10-CM | POA: Insufficient documentation

## 2023-04-10 DIAGNOSIS — R051 Acute cough: Secondary | ICD-10-CM | POA: Diagnosis not present

## 2023-04-10 DIAGNOSIS — R059 Cough, unspecified: Secondary | ICD-10-CM | POA: Diagnosis present

## 2023-04-10 LAB — CBC WITH DIFFERENTIAL/PLATELET
Abs Immature Granulocytes: 0.05 10*3/uL (ref 0.00–0.07)
Basophils Absolute: 0.1 10*3/uL (ref 0.0–0.1)
Basophils Relative: 1 %
Eosinophils Absolute: 0.2 10*3/uL (ref 0.0–0.5)
Eosinophils Relative: 2 %
HCT: 40.1 % (ref 39.0–52.0)
Hemoglobin: 13 g/dL (ref 13.0–17.0)
Immature Granulocytes: 1 %
Lymphocytes Relative: 39 %
Lymphs Abs: 4.1 10*3/uL — ABNORMAL HIGH (ref 0.7–4.0)
MCH: 28.2 pg (ref 26.0–34.0)
MCHC: 32.4 g/dL (ref 30.0–36.0)
MCV: 87 fL (ref 80.0–100.0)
Monocytes Absolute: 0.7 10*3/uL (ref 0.1–1.0)
Monocytes Relative: 7 %
Neutro Abs: 5.3 10*3/uL (ref 1.7–7.7)
Neutrophils Relative %: 50 %
Platelets: 171 10*3/uL (ref 150–400)
RBC: 4.61 MIL/uL (ref 4.22–5.81)
RDW: 13.9 % (ref 11.5–15.5)
WBC: 10.5 10*3/uL (ref 4.0–10.5)
nRBC: 0 % (ref 0.0–0.2)

## 2023-04-10 LAB — COMPREHENSIVE METABOLIC PANEL
ALT: 14 U/L (ref 0–44)
AST: 23 U/L (ref 15–41)
Albumin: 3.7 g/dL (ref 3.5–5.0)
Alkaline Phosphatase: 40 U/L (ref 38–126)
Anion gap: 12 (ref 5–15)
BUN: 17 mg/dL (ref 8–23)
CO2: 19 mmol/L — ABNORMAL LOW (ref 22–32)
Calcium: 9.3 mg/dL (ref 8.9–10.3)
Chloride: 107 mmol/L (ref 98–111)
Creatinine, Ser: 1.61 mg/dL — ABNORMAL HIGH (ref 0.61–1.24)
GFR, Estimated: 43 mL/min — ABNORMAL LOW (ref >60)
Glucose, Bld: 196 mg/dL — ABNORMAL HIGH (ref 70–99)
Potassium: 3.4 mmol/L — ABNORMAL LOW (ref 3.5–5.1)
Sodium: 138 mmol/L (ref 135–145)
Total Bilirubin: 0.9 mg/dL (ref 0.0–1.2)
Total Protein: 7 g/dL (ref 6.5–8.1)

## 2023-04-10 LAB — LIPASE, BLOOD: Lipase: 42 U/L (ref 11–51)

## 2023-04-10 LAB — TROPONIN I (HIGH SENSITIVITY)
Troponin I (High Sensitivity): 9 ng/L (ref <18)
Troponin I (High Sensitivity): 15 ng/L (ref <18)

## 2023-04-10 LAB — BRAIN NATRIURETIC PEPTIDE: B Natriuretic Peptide: 60.1 pg/mL (ref 0.0–100.0)

## 2023-04-10 MED ORDER — ONDANSETRON 4 MG PO TBDP: ORAL_TABLET | ORAL | 0 refills | Status: DC

## 2023-04-10 MED ORDER — ONDANSETRON HCL 4 MG/2ML IJ SOLN
4.0000 mg | Freq: Once | INTRAMUSCULAR | Status: AC
  Administered 2023-04-10: 4 mg via INTRAVENOUS

## 2023-04-10 NOTE — ED Triage Notes (Signed)
 BIB [facility] from home, lives with wife, woke aroun d 0800 with sob, cough, dizziness, VS initially stable, then labile, NSR, then developed frequent ectopy, PVCs & PACs, developed confusion/ AMS, diaphoretic, clammy and cold. EMS verbalizes concern for PE. No blood thinners. NSL x2. BS 186. VS: 166/90, HR 90, ((% RA, but placed on NRB. Arrives to Full team: RNx2, EDP, RT, and Pharm D. Activel coughing with post tussive emesis vs. Vomiting.

## 2023-04-10 NOTE — ED Notes (Signed)
 Calm, NAD, family x2 arrive to [facility], speech clear, coughing and spitting intermittently, clammy, drier, 100% RA.

## 2023-04-10 NOTE — ED Provider Notes (Signed)
 [facility] AT [facility] Provider Note   CSN: [id number] Arrival date & time: 04/10/23  9045     History  Chief Complaint  Patient presents with   Shortness of Breath    Larry Liu is a 83 y.o. male.  83 yo M with a chief complaints of difficulty breathing.  Reportedly the patient was at his normal state of health this morning and then suddenly felt unwell.  EMS was called out due to difficulty breathing.  He did have having episodes of confusion and panic and had had some episodes of emesis.  He was placed on a nonrebreather and was brought here for evaluation.  Patient states that he feels sick to his stomach.  Is having some chest pain as well.   Shortness of Breath      Home Medications Prior to Admission medications   Medication Sig Start Date End Date Taking? Authorizing Provider  aspirin  EC 81 MG tablet Take 81 mg by mouth daily. 12/08/12  Yes [provider]  atenolol (TENORMIN) 50 MG tablet Take 50 mg by mouth daily. 12/08/12  Yes [provider]  fluticasone (FLONASE) 50 MCG/ACT nasal spray Place 1 spray into the nose daily. 12/08/12  Yes [provider]  ondansetron  (ZOFRAN -ODT) 4 MG disintegrating tablet 4mg  ODT q4 hours prn nausea/vomit 04/10/23  Yes Tania Perrott, DO  tamsulosin (FLOMAX) 0.4 MG CAPS capsule Take 0.4 mg by mouth at bedtime. 02/12/23  Yes [provider]  amoxicillin (AMOXIL) 500 MG capsule Take 2,000 mg by mouth See admin instructions. Take 2,000 mg by mouth one hour prior to dental procedures    [provider]  atorvastatin (LIPITOR) 80 MG tablet Take 80 mg by mouth daily.   Yes [provider]  Brinzolamide -Brimonidine  (SIMBRINZA) 1-0.2 % SUSP Place 1 drop into both eyes 3 (three) times daily.   Yes [provider]  buPROPion  (WELLBUTRIN  SR) 100 MG 12 hr tablet Take 100 mg by mouth 2 (two) times daily. MORNING and NOON   Yes [provider]  latanoprost   (XALATAN ) 0.005 % ophthalmic solution Place 1 drop into both eyes at bedtime.   Yes [provider]  losartan (COZAAR) 100 MG tablet Take 100 mg by mouth daily.   Yes [provider]  meloxicam (MOBIC) 15 MG tablet Take 15 mg by mouth daily as needed (for knee pain).    Yes [provider]  pioglitazone (ACTOS) 30 MG tablet Take 30 mg by mouth daily.   Yes [provider]  prazosin  (MINIPRESS ) 2 MG capsule Take 2 mg by mouth at bedtime as needed (for nightmares).   Yes [provider]  zolpidem  (AMBIEN ) 10 MG tablet Take 10 mg by mouth at bedtime.   Yes [provider]      Allergies    Lisinopril and Hydrocodone     Review of Systems   Review of Systems  Respiratory:  Positive for shortness of breath.     Physical Exam Updated Vital Signs BP (!) 143/69 (BP Location: Right Arm)   Pulse 69   Temp (!) 97.5 F (36.4 C) (Oral)   Resp 11   Ht 5' 10 (1.778 m)   Wt 120.2 kg   SpO2 100%   BMI 38.02 kg/m  Physical Exam Vitals and nursing note reviewed.  Constitutional:      Appearance: He is well-developed.  HENT:     Head: Normocephalic and atraumatic.  Eyes:     Pupils:  Pupils are equal, round, and reactive to light.  Neck:     Vascular: No JVD.  Cardiovascular:     Rate and Rhythm: Normal rate and regular rhythm.     Heart sounds: No murmur heard.    No friction rub. No gallop.  Pulmonary:     Effort: No respiratory distress.     Breath sounds: No wheezing.  Abdominal:     General: There is no distension.     Tenderness: There is no abdominal tenderness. There is no guarding or rebound.  Musculoskeletal:        General: Normal range of motion.     Cervical back: Normal range of motion and neck supple.  Skin:    Coloration: Skin is not pale.     Findings: No rash.  Neurological:     Mental Status: He is alert and oriented to person, place, and time.  Psychiatric:        Behavior: Behavior normal.     ED  Results / Procedures / Treatments   Labs (all labs ordered are listed, but only abnormal results are displayed) Labs Reviewed  CBC WITH DIFFERENTIAL/PLATELET - Abnormal; Notable for the following components:      Result Value   Lymphs Abs 4.1 (*)    All other components within normal limits  COMPREHENSIVE METABOLIC PANEL - Abnormal; Notable for the following components:   Potassium 3.4 (*)    CO2 19 (*)    Glucose, Bld 196 (*)    Creatinine, Ser 1.61 (*)    GFR, Estimated 43 (*)    All other components within normal limits  BRAIN NATRIURETIC PEPTIDE  LIPASE, BLOOD  TROPONIN I (HIGH SENSITIVITY)  TROPONIN I (HIGH SENSITIVITY)    EKG EKG Interpretation Date/Time:  Thursday April 10 2023 09:59:19 EST Ventricular Rate:  86 PR Interval:  171 QRS Duration:  88 QT Interval:  381 QTC Calculation: 456 R Axis:   15  Text Interpretation: Sinus rhythm Multiple premature complexes, vent & supraven Minimal ST elevation, anterior leads Baseline wander in lead(s) V5 V6 No significant change since last tracing Confirmed by Emil Share [id number]) on 04/10/2023 10:35:43 AM  Radiology DG Chest Portable 1 View Result Date: 04/10/2023 CLINICAL DATA:  Respiratory distress. Shortness of breath. Cough/emesis. EXAM: PORTABLE CHEST 1 VIEW COMPARISON:  06/30/2018. FINDINGS: Bilateral lung fields are clear. Bilateral costophrenic angles are clear. Normal cardio-mediastinal silhouette. Note is made of elevated left hemidiaphragm. No acute osseous abnormalities. The soft tissues are within normal limits. IMPRESSION: No active disease. Electronically Signed   By: Ree Molt M.D.   On: 04/10/2023 10:22    Procedures .1-3 Lead EKG Interpretation  Performed by: Emil Share, DO Authorized by: Emil Share, DO     Interpretation: normal     ECG rate:  68   ECG rate assessment: normal     Rhythm: sinus rhythm     Ectopy: none     Conduction: normal       Medications Ordered in ED Medications  ondansetron   (ZOFRAN ) injection 4 mg (4 mg Intravenous Given 04/10/23 1000)    ED Course/ Medical Decision Making/ A&P                                 Medical Decision Making Amount and/or Complexity of Data Reviewed Labs: ordered. Radiology: ordered.  Risk Prescription drug management.   83 yo M with a chief complaints of suddenly  feeling unwell.  This occurred just prior to arrival.  Patient is able to talk with me but provides limited history due to persistent nausea.  Will treat nausea here.  Blood work.  Chest x-ray.  Patient was reassessed and now feels much better.  Denies any symptoms.  Chest x-ray independently interpreted by me without focal true pneumothorax.  Troponin negative BNP negative.  No acute anemia no significant electrolyte abnormalities.  Will await second troponin.  Continue to observe in the ED.   Second troponin has risen from the first but is still negative.  Patient continues to be asymptomatic.  He is able to provide a better history now so that he woke up this morning with a coughing fit and did not seem to be able to get under control which is why it called EMS.  He thinks it is due to some chocolate cake that he ate last night.  Family states that they did not get sick from the chocolate cake.  I would have the patient had an aspiration event and then had a vagal event with it.  Regardless I did offer admission to the patient as he had acute, not completely explained symptoms, and reportedly lost consciousness with EMS.  He would prefer to go home at this time.  Will follow-up with his family doctor in the office.  2:39 PM:  I have discussed the diagnosis/risks/treatment options with the patient and family.  Evaluation and diagnostic testing in the emergency department does not suggest an emergent condition requiring admission or immediate intervention beyond what has been performed at this time.  They will follow up with PCP. We also discussed returning to the ED  immediately if new or worsening sx occur. We discussed the sx which are most concerning (e.g., sudden worsening pain, fever, inability to tolerate by mouth, chest pain, syncope) that necessitate immediate return. Medications administered to the patient during their visit and any new prescriptions provided to the patient are listed below.  Medications given during this visit Medications  ondansetron  (ZOFRAN ) injection 4 mg (4 mg Intravenous Given 04/10/23 1000)     The patient appears reasonably screen and/or stabilized for discharge and I doubt any other medical condition or other [facility] requiring further screening, evaluation, or treatment in the ED at this time prior to discharge.         Final Clinical Impression(s) / ED Diagnoses Final diagnoses:  Acute cough  Syncope and collapse    Rx / DC Orders ED Discharge Orders          Ordered    ondansetron  (ZOFRAN -ODT) 4 MG disintegrating tablet        04/10/23 1437              Emil Share, DO 04/10/23 1439

## 2023-04-10 NOTE — Discharge Instructions (Addendum)
 As I discussed with you are not sure the exact cause of your symptoms.  Please return for any worsening especially if you develop chest pain difficulty breathing if you pass out.  Please call your doctor today and let them know about your visit here.

## 2023-04-10 NOTE — ED Notes (Signed)
 Cold clammy diaphoretic, sob, LS CTA, NRB removed, returned to RA, cough / emesis, zofran given. ED{, RT, Pharm D at [facility], pt alert, following directions.

## 2023-04-10 NOTE — ED Notes (Addendum)
 Alert, NAD, calm, interactive, resps e/u, skin W&D, speaking with family x4 at [facility]. VSS.

## 2023-04-10 NOTE — ED Notes (Signed)
 Xray at [facility], calmer, VSS, following commands, tolerating RA, 100%.  Less coughing and gagging.

## 2023-10-29 NOTE — Progress Notes (Signed)
 Physical Therapy Missed Visit Note  Payor: [employer] / Plan: [facility] / Product Type: Other /     The patient did not attend therapy appointment.  Reason:  No Show  Total Missed Visits:  >3  Patient Contact:  No Contact with patient  Future Appointments:  Patient has additional appointments.  Action:  Future appointments will be cancelled and patient will be discharged.

## 2024-01-15 ENCOUNTER — Encounter (HOSPITAL_COMMUNITY): Payer: Self-pay

## 2024-01-15 ENCOUNTER — Emergency Department (HOSPITAL_COMMUNITY)

## 2024-01-15 ENCOUNTER — Other Ambulatory Visit: Payer: Self-pay

## 2024-01-15 ENCOUNTER — Observation Stay (HOSPITAL_COMMUNITY)
Admission: EM | Admit: 2024-01-15 | Discharge: 2024-01-16 | Disposition: A | Discharge status: Home / Self Care | Attending: Internal Medicine | Admitting: Internal Medicine

## 2024-01-15 DIAGNOSIS — N1831 Chronic kidney disease, stage 3a: Secondary | ICD-10-CM | POA: Diagnosis not present

## 2024-01-15 DIAGNOSIS — Z79899 Other long term (current) drug therapy: Secondary | ICD-10-CM | POA: Diagnosis not present

## 2024-01-15 DIAGNOSIS — N400 Enlarged prostate without lower urinary tract symptoms: Secondary | ICD-10-CM | POA: Insufficient documentation

## 2024-01-15 DIAGNOSIS — E1122 Type 2 diabetes mellitus with diabetic chronic kidney disease: Secondary | ICD-10-CM | POA: Insufficient documentation

## 2024-01-15 DIAGNOSIS — E785 Hyperlipidemia, unspecified: Secondary | ICD-10-CM | POA: Insufficient documentation

## 2024-01-15 DIAGNOSIS — N179 Acute kidney failure, unspecified: Secondary | ICD-10-CM | POA: Diagnosis not present

## 2024-01-15 DIAGNOSIS — R55 Syncope and collapse: Principal | ICD-10-CM | POA: Diagnosis present

## 2024-01-15 DIAGNOSIS — I10 Essential (primary) hypertension: Secondary | ICD-10-CM | POA: Diagnosis present

## 2024-01-15 DIAGNOSIS — Z7982 Long term (current) use of aspirin: Secondary | ICD-10-CM | POA: Diagnosis not present

## 2024-01-15 DIAGNOSIS — Z96653 Presence of artificial knee joint, bilateral: Secondary | ICD-10-CM | POA: Insufficient documentation

## 2024-01-15 DIAGNOSIS — I129 Hypertensive chronic kidney disease with stage 1 through stage 4 chronic kidney disease, or unspecified chronic kidney disease: Secondary | ICD-10-CM | POA: Diagnosis not present

## 2024-01-15 DIAGNOSIS — N189 Chronic kidney disease, unspecified: Secondary | ICD-10-CM | POA: Diagnosis present

## 2024-01-15 LAB — CBC
HCT: 39.7 % (ref 39.0–52.0)
Hemoglobin: 13.1 g/dL (ref 13.0–17.0)
MCH: 28.7 pg (ref 26.0–34.0)
MCHC: 33 g/dL (ref 30.0–36.0)
MCV: 86.9 fL (ref 80.0–100.0)
Platelets: 179 K/uL (ref 150–400)
RBC: 4.57 MIL/uL (ref 4.22–5.81)
RDW: 15.3 % (ref 11.5–15.5)
WBC: 6.3 K/uL (ref 4.0–10.5)
nRBC: 0 % (ref 0.0–0.2)

## 2024-01-15 LAB — URINALYSIS, ROUTINE W REFLEX MICROSCOPIC
Glucose, UA: NEGATIVE mg/dL
Hgb urine dipstick: NEGATIVE
Ketones, ur: 5 mg/dL — AB
Leukocytes,Ua: NEGATIVE
Nitrite: NEGATIVE
Protein, ur: 30 mg/dL — AB
Specific Gravity, Urine: 1.02 (ref 1.005–1.030)
pH: 5 (ref 5.0–8.0)

## 2024-01-15 LAB — COMPREHENSIVE METABOLIC PANEL WITH GFR
ALT: 12 U/L (ref 0–44)
AST: 24 U/L (ref 15–41)
Albumin: 3.5 g/dL (ref 3.5–5.0)
Alkaline Phosphatase: 42 U/L (ref 38–126)
Anion gap: 13 (ref 5–15)
BUN: 17 mg/dL (ref 8–23)
CO2: 20 mmol/L — ABNORMAL LOW (ref 22–32)
Calcium: 8.8 mg/dL — ABNORMAL LOW (ref 8.9–10.3)
Chloride: 103 mmol/L (ref 98–111)
Creatinine, Ser: 1.98 mg/dL — ABNORMAL HIGH (ref 0.61–1.24)
GFR, Estimated: 33 mL/min — ABNORMAL LOW (ref >60)
Glucose, Bld: 150 mg/dL — ABNORMAL HIGH (ref 70–99)
Potassium: 4.1 mmol/L (ref 3.5–5.1)
Sodium: 136 mmol/L (ref 135–145)
Total Bilirubin: 0.7 mg/dL (ref 0.0–1.2)
Total Protein: 6.4 g/dL — ABNORMAL LOW (ref 6.5–8.1)

## 2024-01-15 LAB — TROPONIN I (HIGH SENSITIVITY)
Troponin I (High Sensitivity): 11 ng/L (ref <18)
Troponin I (High Sensitivity): 9 ng/L (ref <18)

## 2024-01-15 LAB — CBG MONITORING, ED: Glucose-Capillary: 147 mg/dL — ABNORMAL HIGH (ref 70–99)

## 2024-01-15 MED ORDER — ENOXAPARIN SODIUM 40 MG/0.4ML IJ SOSY
40.0000 mg | PREFILLED_SYRINGE | INTRAMUSCULAR | Status: DC
  Administered 2024-01-15: 40 mg via SUBCUTANEOUS
  Filled 2024-01-15: qty 0.4

## 2024-01-15 MED ORDER — ROSUVASTATIN CALCIUM 20 MG PO TABS
20.0000 mg | ORAL_TABLET | Freq: Every day | ORAL | Status: DC
  Administered 2024-01-16: 20 mg via ORAL
  Filled 2024-01-15: qty 1

## 2024-01-15 MED ORDER — ACETAMINOPHEN 325 MG PO TABS: 650.0000 mg | ORAL_TABLET | Freq: Four times a day (QID) | ORAL | Status: DC | PRN

## 2024-01-15 MED ORDER — DULOXETINE HCL 30 MG PO CPEP
30.0000 mg | ORAL_CAPSULE | Freq: Every day | ORAL | Status: DC
  Administered 2024-01-16: 30 mg via ORAL
  Filled 2024-01-15: qty 1

## 2024-01-15 MED ORDER — LACTATED RINGERS IV SOLN: INTRAVENOUS | Status: AC

## 2024-01-15 MED ORDER — SENNOSIDES-DOCUSATE SODIUM 8.6-50 MG PO TABS: 1.0000 | ORAL_TABLET | Freq: Every evening | ORAL | Status: DC | PRN

## 2024-01-15 MED ORDER — SODIUM CHLORIDE 0.9% FLUSH
3.0000 mL | Freq: Two times a day (BID) | INTRAVENOUS | Status: DC
  Administered 2024-01-15 – 2024-01-16 (×2): 3 mL via INTRAVENOUS

## 2024-01-15 MED ORDER — ONDANSETRON HCL 4 MG PO TABS: 4.0000 mg | ORAL_TABLET | Freq: Four times a day (QID) | ORAL | Status: DC | PRN

## 2024-01-15 MED ORDER — ACETAMINOPHEN 650 MG RE SUPP: 650.0000 mg | Freq: Four times a day (QID) | RECTAL | Status: DC | PRN

## 2024-01-15 MED ORDER — ONDANSETRON HCL 4 MG/2ML IJ SOLN: 4.0000 mg | Freq: Four times a day (QID) | INTRAMUSCULAR | Status: DC | PRN

## 2024-01-15 MED ORDER — SODIUM CHLORIDE 0.9 % IV BOLUS
1000.0000 mL | Freq: Once | INTRAVENOUS | Status: AC
  Administered 2024-01-15: 1000 mL via INTRAVENOUS

## 2024-01-15 NOTE — ED Triage Notes (Signed)
 Pt states he was at a restaurant paying for his food when he passed out. Pt doesn't remember the incident. Pt denies injury from the fall. Pt has no complaints at this time.

## 2024-01-15 NOTE — ED Provider Triage Note (Signed)
 Emergency Medicine Provider Triage Evaluation Note  Larry Liu , a 83 y.o. male  was evaluated in triage.  Pt complains of syncopal episode earlier while at a restaurant pink for food.  Patient does not remember the incident.  He denies any injury from the fall.  He denies any complaints at this time.  Reports some mild dizziness and blurry vision after the fall.  He reports 1 previous episode of the same, with no identified cause.  Denies any chest pain, shortness of breath, chills, has been eating and drinking like normally.  Review of Systems  Positive: Syncope Negative:   Physical Exam  BP (!) 107/54 (BP Location: Left Arm)   Pulse 77   Temp (!) 97.5 F (36.4 C)   Resp 18   Ht 6' (1.829 m)   Wt 108.9 kg   SpO2 93%   BMI 32.55 kg/m  Gen:   Awake, no distress   Resp:  Normal effort  MSK:   Moves extremities without difficulty  Other:  Heart murmur auscultated, patient reports that this is a known, chronic issue.  Medical Decision Making  Medically screening exam initiated at 2:51 PM.  Appropriate orders placed.  Larry Liu was informed that the remainder of the evaluation will be completed by another provider, this initial triage assessment does not replace that evaluation, and the importance of remaining in the ED until their evaluation is complete.  Workup initiated in triage    Larry Sherlean DEL, PA-C 01/15/24 1451

## 2024-01-15 NOTE — Hospital Course (Addendum)
 83 y.o. male with medical history significant for T2DM, CKD stage IIIa, HTN, HLD, BPH who presented to the ED for evaluation after syncopal episode.   Patient states that he was finished up eating breakfast at a restaurant.  He stood up and was walking to the counter to pay his bill when he started feeling lightheaded/dizzy.  The next thing he knows he is waking up with people around him telling him that he passed out.  He does not believe he hit his head.  He denies any recent chest pain, dyspnea, nausea, vomiting, abdominal pain, diarrhea.  He reports a similar episode last year.  He denies any recent changes in his medications.

## 2024-01-15 NOTE — H&P (Signed)
 History and Physical    TAHJAY Liu FMW:[id number] DOB: 1940/06/12 DOA: 01/15/2024  PCP: [facility]  Patient coming from: Home  I have personally briefly reviewed patient's old medical records in [facility]  Chief Complaint: Syncope  HPI: Larry Liu is a 83 y.o. male with medical history significant for T2DM, CKD stage IIIa, HTN, HLD, BPH who presented to the ED for evaluation after syncopal episode.  Patient states that he was finished up eating breakfast at a restaurant.  He stood up and was walking to the counter to pay his bill when he started feeling lightheaded/dizzy.  The next thing he knows he is waking up with people around him telling him that he passed out.  He does not believe he hit his head.  He denies any recent chest pain, dyspnea, nausea, vomiting, abdominal pain, diarrhea.  He reports a similar episode last year.  He denies any recent changes in his medications.  ED Course  Labs/Imaging on admission: I have personally reviewed following labs and imaging studies.  Initial vitals showed BP 107/54, pulse 77, RR 18, temp 97.5 F, SpO2 93% on room air.  Labs showed sodium 136, potassium 4.1, bicarb 20, BUN 17, creatinine 1.98, serum glucose 150, LFTs within normal limits, WBC 6.3, hemoglobin 13.1, platelets 179, troponin 11 > 9.  CT head without contrast showed chronic atrophic ischemic changes.  Soft tissue swelling consistent with recent injury noted.  Patient was given 1 L normal saline and the hospitalist service was consulted for admission.  Review of Systems: All systems reviewed and are negative except as documented in history of present illness above.   Past Medical History:  Diagnosis Date   Diabetes mellitus without complication (HCC)    High cholesterol    Hypertension    Obesity     Past Surgical History:  Procedure Laterality Date   REPLACEMENT TOTAL KNEE BILATERAL      Social History: Social History   Tobacco Use    Smoking status: Former   Smokeless tobacco: Never  Substance Use Topics   Alcohol use: Yes    Comment: occ   Drug use: No    Allergies  Allergen Reactions   Lisinopril Cough, Other (See Comments), Rash and Dermatitis    Other Reaction(s): Eruption, OTHER REACTION   Hydrocodone  Nausea And Vomiting    Family History  Problem Relation Age of Onset   Stroke Mother    Stroke Father      Prior to Admission medications   Medication Sig Start Date End Date Taking? Authorizing Provider  amoxicillin (AMOXIL) 500 MG capsule Take 2,000 mg by mouth See admin instructions. Take 2,000 mg by mouth one hour prior to dental procedures    [provider]  aspirin EC 81 MG tablet Take 81 mg by mouth daily. 12/08/12   [provider]  atenolol (TENORMIN) 50 MG tablet Take 50 mg by mouth daily. 12/08/12   [provider]  atorvastatin (LIPITOR) 80 MG tablet Take 80 mg by mouth daily.    [provider]  Brinzolamide -Brimonidine  (SIMBRINZA) 1-0.2 % SUSP Place 1 drop into both eyes 3 (three) times daily.    [provider]  buPROPion  (WELLBUTRIN  SR) 100 MG 12 hr tablet Take 100 mg by mouth 2 (two) times daily. MORNING and NOON    [provider]  fluticasone (FLONASE) 50 MCG/ACT nasal spray Place 1 spray into the nose daily. 12/08/12   [provider]  latanoprost  (XALATAN ) 0.005 %  ophthalmic solution Place 1 drop into both eyes at bedtime.    [provider]  losartan (COZAAR) 100 MG tablet Take 100 mg by mouth daily.    [provider]  meloxicam (MOBIC) 15 MG tablet Take 15 mg by mouth daily as needed (for knee pain).     [provider]  ondansetron  (ZOFRAN -ODT) 4 MG disintegrating tablet 4mg  ODT q4 hours prn nausea/vomit 04/10/23   Floyd, Dan, DO  pioglitazone (ACTOS) 30 MG tablet Take 30 mg by mouth daily.    [provider]  prazosin  (MINIPRESS ) 2 MG capsule Take 2 mg by mouth at bedtime as needed (for  nightmares).    [provider]  tamsulosin (FLOMAX) 0.4 MG CAPS capsule Take 0.4 mg by mouth at bedtime. 02/12/23   [provider]  zolpidem  (AMBIEN ) 10 MG tablet Take 10 mg by mouth at bedtime.    [provider]    Physical Exam: Vitals:   01/15/24 1702 01/15/24 1745 01/15/24 1930 01/15/24 1947  BP: (!) 102/55 112/68 139/73 136/82  Pulse: 64 (!) 58 (!) 59 65  Resp: 15 15  19   Temp:    (!) 97.4 F (36.3 C)  TempSrc:    Oral  SpO2: 100% 100% 100% 100%  Weight:      Height:       Constitutional: Resting in bed with head elevated.  NAD, calm, comfortable Eyes: EOMI, lids and conjunctivae normal ENMT: Mucous membranes are moist. Posterior pharynx clear of any exudate or lesions.Normal dentition.  Neck: normal, supple, no masses. Respiratory: clear to auscultation bilaterally, no wheezing, no crackles. Normal respiratory effort. No accessory muscle use.  Cardiovascular: Regular rate and rhythm, 3/6 systolic murmur. No extremity edema. 2+ pedal pulses. Abdomen: no tenderness, no masses palpated. Musculoskeletal: no clubbing / cyanosis. No joint deformity upper and lower extremities. Good ROM, no contractures. Normal muscle tone.  Skin: no rashes, lesions, ulcers. No induration Neurologic: Sensation intact. Strength 5/5 in all 4.  Psychiatric: Normal judgment and insight. Alert and oriented x 3. Normal mood.   EKG: Personally reviewed. Sinus rhythm, rate 75, no acute ischemic changes.  Frequent premature complexes no longer present when compared to previous.  Assessment/Plan Principal Problem:   Syncope Active Problems:   Hypertension   Acute kidney injury superimposed on chronic kidney disease   Larry Liu is a 83 y.o. male with medical history significant for T2DM, CKD stage IIIa, HTN, HLD, BPH who is admitted for syncope evaluation.  Assessment and Plan: Syncope: History suggestive of orthostatic hypotension as etiology.  Orthostatic vitals are  positive with drop in SBP from 137 >>113 when going from lying to standing position.  Start murmur noted. - Keep on telemetry - Obtain echocardiogram - IV fluid hydration overnight - PT/OT eval, fall precautions - Holding atenolol, losartan, Flomax  Acute kidney injury superimposed on CKD stage IIIa: Creatinine 1.98 on admission compared to baseline ~1.6.  Holding losartan.  Continue gentle IV fluid hydration overnight and repeat labs in AM.  Hypertension: Holding antihypertensives for now.  Hyperlipidemia: Continue rosuvastatin.  BPH: Flomax for now.   DVT prophylaxis: enoxaparin  (LOVENOX ) injection 40 mg Start: 01/15/24 2200 Code Status: Full code, confirmed with patient on admission Family Communication: Cussed with patient, he has discussed with family Disposition Plan: From home and likely discharge to home pending clinical progress Consults called: None Severity of Illness: The appropriate patient status for this patient is OBSERVATION. Observation status is judged to be reasonable and necessary in  order to provide the required intensity of service to ensure the patient's safety. The patient's presenting symptoms, physical exam findings, and initial radiographic and laboratory data in the context of their medical condition is felt to place them at decreased risk for further clinical deterioration. Furthermore, it is anticipated that the patient will be medically stable for discharge from the [facility] within 2 midnights of admission.   Jorie Blanch MD [facility]  If 7PM-7AM, please contact night-coverage [email or web address]  01/15/2024, 10:25 PM

## 2024-01-15 NOTE — ED Provider Notes (Signed)
 [place] [facility] AT [facility] Provider Note   CSN: [id number] Arrival date & time: 01/15/24  1358     Patient presents with: Loss of Consciousness   Larry Liu is a 83 y.o. male.  Past medical history seen for diabetes, hypertension, hyperlipidemia, obesity.  Pt complains of syncopal episode earlier while at a restaurant pink for food. Patient does not remember the incident. He denies any injury from the fall. He denies any complaints at this time. Reports some mild dizziness and blurry vision after the fall. He reports 1 previous episode of the same, with no identified cause. Denies any chest pain, shortness of breath, chills, has been eating and drinking like normally.     Loss of Consciousness      Prior to Admission medications   Medication Sig Start Date End Date Taking? Authorizing Provider  amoxicillin (AMOXIL) 500 MG capsule Take 2,000 mg by mouth See admin instructions. Take 2,000 mg by mouth one hour prior to dental procedures    [provider]  aspirin EC 81 MG tablet Take 81 mg by mouth daily. 12/08/12   [provider]  atenolol (TENORMIN) 50 MG tablet Take 50 mg by mouth daily. 12/08/12   [provider]  atorvastatin (LIPITOR) 80 MG tablet Take 80 mg by mouth daily.    [provider]  Brinzolamide -Brimonidine  (SIMBRINZA) 1-0.2 % SUSP Place 1 drop into both eyes 3 (three) times daily.    [provider]  buPROPion  (WELLBUTRIN  SR) 100 MG 12 hr tablet Take 100 mg by mouth 2 (two) times daily. MORNING and NOON    [provider]  fluticasone (FLONASE) 50 MCG/ACT nasal spray Place 1 spray into the nose daily. 12/08/12   [provider]  latanoprost  (XALATAN ) 0.005 % ophthalmic solution Place 1 drop into both eyes at bedtime.    [provider]  losartan (COZAAR) 100 MG tablet Take 100 mg by mouth daily.    [provider]  meloxicam (MOBIC) 15 MG tablet Take 15 mg by  mouth daily as needed (for knee pain).     [provider]  ondansetron  (ZOFRAN -ODT) 4 MG disintegrating tablet 4mg  ODT q4 hours prn nausea/vomit 04/10/23   Floyd, Dan, DO  pioglitazone (ACTOS) 30 MG tablet Take 30 mg by mouth daily.    [provider]  prazosin  (MINIPRESS ) 2 MG capsule Take 2 mg by mouth at bedtime as needed (for nightmares).    [provider]  tamsulosin (FLOMAX) 0.4 MG CAPS capsule Take 0.4 mg by mouth at bedtime. 02/12/23   [provider]  zolpidem  (AMBIEN ) 10 MG tablet Take 10 mg by mouth at bedtime.    [provider]    Allergies: Lisinopril and Hydrocodone     Review of Systems  Cardiovascular:  Positive for syncope.  All other systems reviewed and are negative.   Updated Vital Signs BP 112/68   Pulse (!) 58   Temp 97.8 F (36.6 C) (Oral)   Resp 15   Ht 6' (1.829 m)   Wt 108.9 kg   SpO2 100%   BMI 32.55 kg/m   Physical Exam Vitals and nursing note reviewed.  Constitutional:      General: He is not in acute distress.    Appearance: Normal appearance.  HENT:     Head: Normocephalic and atraumatic.  Eyes:     General:        Right eye: No discharge.  Left eye: No discharge.  Cardiovascular:     Rate and Rhythm: Normal rate and regular rhythm.     Heart sounds: No murmur heard.    No friction rub. No gallop.  Pulmonary:     Effort: Pulmonary effort is normal.     Breath sounds: Normal breath sounds.  Abdominal:     General: Bowel sounds are normal.     Palpations: Abdomen is soft.  Skin:    General: Skin is warm and dry.     Capillary Refill: Capillary refill takes less than 2 seconds.  Neurological:     Mental Status: He is alert and oriented to person, place, and time.     Comments: Moves all 4 limbs spontaneously, CN II through XII grossly intact, can ambulate without difficulty, intact sensation throughout.  Psychiatric:        Mood and Affect: Mood normal.        Behavior: Behavior  normal.     (all labs ordered are listed, but only abnormal results are displayed) Labs Reviewed  COMPREHENSIVE METABOLIC PANEL WITH GFR - Abnormal; Notable for the following components:      Result Value   CO2 20 (*)    Glucose, Bld 150 (*)    Creatinine, Ser 1.98 (*)    Calcium 8.8 (*)    Total Protein 6.4 (*)    GFR, Estimated 33 (*)    All other components within normal limits  URINALYSIS, ROUTINE W REFLEX MICROSCOPIC - Abnormal; Notable for the following components:   Color, Urine AMBER (*)    APPearance HAZY (*)    Bilirubin Urine SMALL (*)    Ketones, ur 5 (*)    Protein, ur 30 (*)    Bacteria, UA RARE (*)    All other components within normal limits  CBG MONITORING, ED - Abnormal; Notable for the following components:   Glucose-Capillary 147 (*)    All other components within normal limits  CBC  TROPONIN I (HIGH SENSITIVITY)  TROPONIN I (HIGH SENSITIVITY)    EKG: EKG Interpretation Date/Time:  Thursday January 15 2024 14:18:34 EDT Ventricular Rate:  75 PR Interval:    QRS Duration:  80 QT Interval:  394 QTC Calculation: 439 R Axis:   11  Text Interpretation: Accelerated Junctional rhythm Minimal voltage criteria for LVH, may be normal variant ( R in aVL ) ST & T wave abnormality, consider inferior ischemia Abnormal ECG When compared with ECG of 10-Apr-2023 09:59, PREVIOUS ECG IS PRESENT inferior ST depressions slightly worsened from prior Confirmed by Larry Satterfield ([id number]) on 01/15/2024 3:04:35 PM  Radiology: CT Head Wo Contrast Result Date: 01/15/2024 CLINICAL DATA:  Recent syncopal episode EXAM: CT HEAD WITHOUT CONTRAST TECHNIQUE: Contiguous axial images were obtained from the base of the skull through the vertex without intravenous contrast. RADIATION DOSE REDUCTION: This exam was performed according to the departmental dose-optimization program which includes automated exposure control, adjustment of the mA and/or kV according to patient size and/or use of  iterative reconstruction technique. COMPARISON:  None Available. FINDINGS: Brain: No evidence of acute infarction, hemorrhage, hydrocephalus, extra-axial collection or mass lesion/mass effect. Mild atrophic changes and chronic white matter ischemic changes are seen. Vascular: No hyperdense vessel or unexpected calcification. Skull: Normal. Negative for fracture or focal lesion. Sinuses/Orbits: No acute finding. Other: Mild soft tissue swelling is noted posteriorly related to the recent injury. IMPRESSION: Chronic atrophic and ischemic changes. Soft tissue swelling consistent with the recent injury. Electronically Signed   By: Oneil  Lukens M.D.   On: 01/15/2024 15:25     Procedures   Medications Ordered in the ED  sodium chloride  0.9 % bolus 1,000 mL (0 mLs Intravenous Stopped 01/15/24 1702)                                    Medical Decision Making Amount and/or Complexity of Data Reviewed Labs: ordered. Radiology: ordered.   This patient is a 83 y.o. male  who presents to the ED for concern of syncope.   Differential diagnoses prior to evaluation: The emergent differential diagnosis includes, but is not limited to,  CVA, ACS, arrhythmia, vasovagal / orthostatic hypotension, sepsis, hypoglycemia, electrolyte disturbance, respiratory failure, anemia, dehydration, heat injury, polypharmacy, malignancy, anxiety/panic attack.  . This is not an exhaustive differential.   Past Medical History / Co-morbidities / Social History: Obesity, diabetes, hypertension, hyperlipidemia  Additional history: Chart reviewed. Pertinent results include: Reviewed lab work, imaging from previous emergency department evaluations.  No focal neurologic deficits.  Physical Exam: Physical exam performed. The pertinent findings include: Borderline hypotension on arrival, blood pressure 107/54.  Vital signs otherwise stable.  Lab Tests/Imaging studies: I personally interpreted labs/imaging and the pertinent  results include: CBC unremarkable, CBG mildly elevated at 147, CMP shows somewhat elevated creatinine, 1.98 from baseline around 1.5.  Mild bicarb deficit, CO2 20, troponin 11, delta flat).  CT head without contrast shows no evidence of acute intracranial abnormality.  There is some soft tissue swelling consistent with recent injury.  Likely secondary to his syncopal episode with collapse.  I agree with the radiologist interpretation.  Cardiac monitoring: EKG obtained and interpreted by myself and attending physician which shows: sinus tachycardia -- concerning worsening of ST segments in inferior leads. Does not meet stemi criteria.    Medications: I ordered medication including fluid bolus for hypotension.  I have reviewed the patients home medicines and have made adjustments as needed.   Consults: Spoke with the hospitalist, Dr. Tobie who agrees to admission for her syncope workup, hypotension, Lan for echocardiogram during admission.  Disposition: After consideration of the diagnostic results and the patients response to treatment, I feel that patient would benefit from admission for unexplained syncope, suspected possible cardiac etiology..    Final diagnoses:  Syncope and collapse    ED Discharge Orders     None          Larry Liu 01/15/24 1910    Larry Jerilynn RAMAN, MD 01/16/24 1336

## 2024-01-16 ENCOUNTER — Observation Stay (HOSPITAL_BASED_OUTPATIENT_CLINIC_OR_DEPARTMENT_OTHER)
Admit: 2024-01-16 | Discharge: 2024-01-16 | Disposition: A | Discharge status: Home / Self Care | Attending: Cardiology | Admitting: Cardiology

## 2024-01-16 ENCOUNTER — Observation Stay (HOSPITAL_COMMUNITY)

## 2024-01-16 ENCOUNTER — Telehealth: Payer: Self-pay | Admitting: Cardiology

## 2024-01-16 DIAGNOSIS — I350 Nonrheumatic aortic (valve) stenosis: Secondary | ICD-10-CM

## 2024-01-16 DIAGNOSIS — R55 Syncope and collapse: Secondary | ICD-10-CM

## 2024-01-16 LAB — ECHOCARDIOGRAM COMPLETE
AR max vel: 1.12 cm2
AV Area VTI: 1.24 cm2
AV Area mean vel: 1.26 cm2
AV Mean grad: 20 mmHg
AV Peak grad: 37.9 mmHg
Ao pk vel: 3.08 m/s
Area-P 1/2: 3.37 cm2
Height: 72 in
S' Lateral: 2.38 cm
Weight: 3840 [oz_av]

## 2024-01-16 LAB — BASIC METABOLIC PANEL WITH GFR
Anion gap: 9 (ref 5–15)
BUN: 16 mg/dL (ref 8–23)
CO2: 23 mmol/L (ref 22–32)
Calcium: 8.8 mg/dL — ABNORMAL LOW (ref 8.9–10.3)
Chloride: 104 mmol/L (ref 98–111)
Creatinine, Ser: 1.71 mg/dL — ABNORMAL HIGH (ref 0.61–1.24)
GFR, Estimated: 39 mL/min — ABNORMAL LOW (ref >60)
Glucose, Bld: 101 mg/dL — ABNORMAL HIGH (ref 70–99)
Potassium: 3.6 mmol/L (ref 3.5–5.1)
Sodium: 136 mmol/L (ref 135–145)

## 2024-01-16 LAB — CBC
HCT: 37.9 % — ABNORMAL LOW (ref 39.0–52.0)
Hemoglobin: 12.6 g/dL — ABNORMAL LOW (ref 13.0–17.0)
MCH: 28.4 pg (ref 26.0–34.0)
MCHC: 33.2 g/dL (ref 30.0–36.0)
MCV: 85.4 fL (ref 80.0–100.0)
Platelets: 163 K/uL (ref 150–400)
RBC: 4.44 MIL/uL (ref 4.22–5.81)
RDW: 15.4 % (ref 11.5–15.5)
WBC: 8.8 K/uL (ref 4.0–10.5)
nRBC: 0 % (ref 0.0–0.2)

## 2024-01-16 LAB — CBG MONITORING, ED: Glucose-Capillary: 116 mg/dL — ABNORMAL HIGH (ref 70–99)

## 2024-01-16 MED ORDER — ATENOLOL 25 MG PO TABS: 25.0000 mg | ORAL_TABLET | Freq: Every day | ORAL | Status: AC

## 2024-01-16 MED ORDER — LACTATED RINGERS IV SOLN: INTRAVENOUS | Status: DC

## 2024-01-16 NOTE — Evaluation (Signed)
 Occupational Therapy Evaluation & Discharge Patient Details Name: Larry Liu MRN: [id number] DOB: [birth year] Today's Date: 01/16/2024   History of Present Illness   Pt Is a 83 y.o male presented to ED 10/16 for syncope episode. CT negative for acute changes. Concerns for orthostatic hypotension. PMH: DM, HTN, HLD, obesity     Clinical Impressions Pt admitted based on above, and was seen based on problem list below. PTA pt was independent with ADLs and IADLs. Today pt is at his functional baseline for ADLs and mobility.  Pt completed standing grooming tasks, toileting, and ambulated 36ft with no AD. Pt reporting mild dizziness during mobility, but negative for orthostatics. Pt reporting no self-care concerns or mobility concerns upon d/c. No follow up therapies needed, no further acute needs. OT is signing off on this pt.  Sitting BP: 115/84 (95) Standing BP:133/104 (111) *pt mobilizing may be inaccurate read. Standing after 3 minutes: 138/98 (103), Sitting after 5 minutes of rest: 156/99 (118). RN and MD notified.     If plan is discharge home, recommend the following:   Assist for transportation     Functional Status Assessment   Patient has not had a recent decline in their functional status     Equipment Recommendations   None recommended by OT     Recommendations for Other Services         Precautions/Restrictions   Precautions Precautions: Fall Recall of Precautions/Restrictions: Intact Restrictions Weight Bearing Restrictions Per Provider Order: No     Mobility Bed Mobility Overal bed mobility: Independent     General bed mobility comments: Received EOB on stretcher    Transfers Overall transfer level: Independent Equipment used: None     General transfer comment: Ambulated 328ft, no LOB or sway      Balance Overall balance assessment: Independent         ADL either performed or assessed with clinical judgement   ADL Overall ADL's  : Independent       General ADL Comments: No AD, completed toileting, standing grooming, and functional mobility ind     Vision Baseline Vision/History: 1 Wears glasses Patient Visual Report: No change from baseline Vision Assessment?: No apparent visual deficits            Pertinent Vitals/Pain Pain Assessment Pain Assessment: No/denies pain     Extremity/Trunk Assessment Upper Extremity Assessment Upper Extremity Assessment: Overall WFL for tasks assessed   Lower Extremity Assessment Lower Extremity Assessment: Overall WFL for tasks assessed       Communication Communication Communication: No apparent difficulties   Cognition Arousal: Alert Behavior During Therapy: WFL for tasks assessed/performed Cognition: No apparent impairments       Following commands: Intact       Cueing  General Comments   Cueing Techniques: Verbal cues  Pt negative for orthostatics, reporting mild dizziness.           Home Living Family/patient expects to be discharged to:: Private residence Living Arrangements: Spouse/significant other Available Help at Discharge: Family Type of Home: House Home Access: Stairs to enter Secretary/administrator of Steps: 6 Entrance Stairs-Rails: Can reach both Home Layout: Multi-level Alternate Level Stairs-Number of Steps: Flight Alternate Level Stairs-Rails: Can reach both Bathroom Shower/Tub: Walk-in shower;Tub/shower unit   Bathroom Toilet: Handicapped height     Home Equipment: Agricultural consultant (2 wheels);Cane - single point;BSC/3in1          Prior Functioning/Environment Prior Level of Function : Independent/Modified Independent       Mobility  Comments: No AD, no fall ADLs Comments: Ind    OT Problem List: Cardiopulmonary status limiting activity        OT Goals(Current goals can be found in the care plan section)   Acute Rehab OT Goals Patient Stated Goal: To get better OT Goal Formulation: All assessment and  education complete, DC therapy Time For Goal Achievement: 01/30/24 Potential to Achieve Goals: Good   AM-PAC OT 6 Clicks Daily Activity     Outcome Measure Help from another person eating meals?: None Help from another person taking care of personal grooming?: None Help from another person toileting, which includes using toliet, bedpan, or urinal?: None Help from another person bathing (including washing, rinsing, drying)?: None Help from another person to put on and taking off regular upper body clothing?: None Help from another person to put on and taking off regular lower body clothing?: None 6 Click Score: 24   End of Session Equipment Utilized During Treatment: Gait belt Nurse Communication: Mobility status  Activity Tolerance: Patient tolerated treatment well Patient left: in bed;with call bell/phone within reach  OT Visit Diagnosis: Dizziness and giddiness (R42)                Time: 9259-9199 OT Time Calculation (min): 20 min Charges:  OT General Charges $OT Visit: 1 Visit OT Evaluation $OT Eval Moderate Complexity: 1 Mod  Amiah Frohlich C, OT  Acute Rehabilitation Services Office [phone] Secure chat preferred   Adrianne GORMAN Savers 01/16/2024, 8:41 AM

## 2024-01-16 NOTE — TOC Transition Note (Signed)
 Transition of Care [facility]) - Discharge Note   Patient Details  Name: Larry Liu MRN: [id number] Date of Birth: [birth year]  Transition of [facility] CM/SW Contact:  Andrez JULIANNA George, RN Phone Number: 01/16/2024, 2:47 PM   Clinical Narrative:     Pt is discharging home with self care. No needs per IP Care management.  Final next level of care: Home/Self Care Barriers to Discharge: No Barriers Identified   Patient Goals and CMS Choice            Discharge Placement                       Discharge Plan and Services Additional resources added to the After Visit Summary for                                       Social Drivers of Health (SDOH) Interventions SDOH Screenings   Food Insecurity: Low Risk  (07/29/2023)   Received from [facility]  Housing: Low Risk  (07/29/2023)   Received from [facility]  Transportation Needs: No Transportation Needs (07/29/2023)   Received from [facility]  Utilities: Not At Risk (01/16/2024)  Social Connections: Socially Integrated (01/16/2024)  Tobacco Use: Medium Risk (01/15/2024)     Readmission Risk Interventions     No data to display

## 2024-01-16 NOTE — ED Notes (Signed)
 Pt sitting on the side of the bed, fully dressed. States that he is comfortable. Denies any chest pain, shob, dizziness, or headache at this time. Denies any pain at this time.

## 2024-01-16 NOTE — Progress Notes (Signed)
 PT Cancellation Note  Patient Details Name: Larry Liu MRN: [id number] DOB: 20-Jul-1940   Cancelled Treatment:    Reason Eval/Treat Not Completed: PT screened, no needs identified, will sign off (pt seen by OT and no needs per therapist. will screen at this time)   Arelis Neumeier B Shermeka Rutt 01/16/2024, 8:33 AM Lenoard SQUIBB, PT Acute Rehabilitation Services Office: [phone]

## 2024-01-16 NOTE — Discharge Summary (Signed)
 Physician Discharge Summary   Patient: Larry Liu MRN: [id number] DOB: 1940-08-12  Admit date:     01/15/2024  Discharge date: 01/16/24  Discharge Physician: Garnette Pelt   PCP: [facility]   Recommendations at discharge:    Follow up with PCP in 1-2 weeks  Discharge Diagnoses: Principal Problem:   Syncope Active Problems:   Hypertension   Acute kidney injury superimposed on chronic kidney disease  Resolved Problems:   * No resolved hospital problems. *  Hospital Course: 83 y.o. male with medical history significant for T2DM, CKD stage IIIa, HTN, HLD, BPH who presented to the ED for evaluation after syncopal episode.   Patient states that he was finished up eating breakfast at a restaurant.  He stood up and was walking to the counter to pay his bill when he started feeling lightheaded/dizzy.  The next thing he knows he is waking up with people around him telling him that he passed out.  He does not believe he hit his head.  He denies any recent chest pain, dyspnea, nausea, vomiting, abdominal pain, diarrhea.  He reports a similar episode last year.  He denies any recent changes in his medications.  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  Orthostatic vitals are positive with drop in SBP from 137 >>113 when going from lying to standing position. - Improved with IVF hydration overnight. No longer orthostatic - 2d echo reviewed with Cardiology. Concerns for new severe AS - Cardiology consulted, with recs for [facility] eventually when renal function improves and outpt eval with structural Cardiology   Acute kidney injury superimposed on CKD stage IIIa: Creatinine 1.98 on admission compared to baseline ~1.6.  Holding losartan. Defer to PCP when pt is able to resume -Renal function improved with hydration. Pt reports eating/drinking well    Hypertension: Resume home atenolol on d/c -Hold losartan until renal function stabilizes   Hyperlipidemia: Continue rosuvastatin.   BPH: Flomax for now QHS    Consultants: Cardiology Procedures performed:   Disposition: Home Diet recommendation:  Regular diet DISCHARGE MEDICATION: Allergies as of 01/16/2024       Reactions   Lisinopril Cough, Other (See Comments), Rash, Dermatitis   Other Reaction(s): Eruption, OTHER REACTION   Hydrocodone  Nausea And Vomiting        Medication List     PAUSE taking these medications    losartan 100 MG tablet Wait to take this until your doctor or other care provider tells you to start again. Commonly known as: COZAAR Take 100 mg by mouth daily.       TAKE these medications    amoxicillin 500 MG capsule Commonly known as: AMOXIL Take 2,000 mg by mouth See admin instructions. Take 2,000 mg by mouth one hour prior to dental procedures   aspirin EC 81 MG tablet Take 81 mg by mouth daily.   atenolol 25 MG tablet Commonly known as: TENORMIN Take 1 tablet (25 mg total) by mouth daily.   DULoxetine 30 MG capsule Commonly known as: CYMBALTA Take 30 mg by mouth daily.   fluticasone 50 MCG/ACT nasal spray Commonly known as: FLONASE Place 2 sprays into the nose daily.   glipiZIDE 10 MG tablet Commonly known as: GLUCOTROL Take 10 mg by mouth daily before breakfast.   latanoprost  0.005 % ophthalmic solution Commonly known as: XALATAN  Place 1 drop into both eyes daily.   naloxone 4 MG/0.1ML Liqd  nasal spray kit Commonly known as: NARCAN Place into the nose.   rosuvastatin 40 MG tablet Commonly known as: CRESTOR Take 20 mg by mouth daily.   tamsulosin 0.4 MG Caps capsule Commonly known as: FLOMAX Take 0.4 mg by mouth at bedtime.   zolpidem  10 MG tablet Commonly known as: AMBIEN  Take 10 mg by mouth at bedtime.        Follow-up Information     [facility], [facility] Follow up in 2 week(s).   Why: Hospital  follow up Contact information: [address] [place] [address] [phone]         Wendel Lurena POUR, MD. Go on 01/21/2024.   Specialty: Cardiology Why: We will call you to arrange an appt this day to discuss your heart valve. Contact information: [address] [place] [address] [phone]                Discharge Exam: Fredricka Weights   01/15/24 1425  Weight: 108.9 kg   General exam: Awake, laying in bed, in nad Respiratory system: Normal respiratory effort, no wheezing Cardiovascular system: regular rate, s1, s2 Gastrointestinal system: Soft, nondistended, positive BS Central nervous system: CN2-12 grossly intact, strength intact Extremities: Perfused, no clubbing Skin: Normal skin turgor, no notable skin lesions seen Psychiatry: Mood normal // no visual hallucinations   Condition at discharge: fair  The results of significant diagnostics from this hospitalization (including imaging, microbiology, ancillary and laboratory) are listed below for reference.   Imaging Studies: ECHOCARDIOGRAM COMPLETE Result Date: 01/16/2024    ECHOCARDIOGRAM REPORT   Patient Name:   Larry Liu Date of Exam: 01/16/2024 Medical Rec #:  [id number]      Height:       72.0 in Accession #:    [id number]     Weight:       240.0 lb Date of Birth:  [birth year]      BSA:          2.302 m Patient Age:    83 years       BP:           156/99 mmHg Patient Gender: M              HR:           67 bpm. Exam Location:  Inpatient Procedure: 2D Echo, Cardiac Doppler and Color Doppler (Both Spectral and Color            Flow Doppler were utilized during procedure). Indications:    Syncope  History:        Patient has no prior history of Echocardiogram examinations.                 Risk Factors:Hypertension and Diabetes.  Sonographer:    Philomena Daring Referring Phys: [id number] VISHAL R PATEL IMPRESSIONS  1. Left ventricular ejection fraction, by estimation, is 55 to 60%. The  left ventricle has normal function. The left ventricle has no regional wall motion abnormalities. Left ventricular diastolic parameters are indeterminate.  2. Right ventricular systolic function is normal. The right ventricular size is normal.  3. Left atrial size was mildly dilated.  4. The mitral valve is normal in structure. No evidence of mitral valve regurgitation. Mild mitral stenosis. The mean mitral valve gradient is 3.3 mmHg with average heart rate of 78 bpm. Severe mitral annular calcification.  5. The aortic valve is abnormal. There is severe calcifcation of the aortic valve. Aortic valve regurgitation is trivial. 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  6. The inferior vena cava is normal in size with greater than 50% respiratory variability, suggesting right atrial pressure of 3 mmHg. Comparison(s): No prior Echocardiogram. Conclusion(s)/Recommendation(s): Valvular findings as outlined below. Due to the above findings, cardiology consult is recommended. FINDINGS  Left Ventricle: Left ventricular ejection fraction, by estimation, is 55 to 60%. The left ventricle has normal function. The left ventricle has no regional wall motion abnormalities. The left ventricular internal cavity size was normal in size. There is  no left ventricular hypertrophy. Left ventricular diastolic parameters are indeterminate. Right Ventricle: The right ventricular size is normal. No increase in right ventricular wall thickness. Right ventricular systolic function is normal. Left Atrium: Left atrial size was mildly dilated. Right Atrium: Right atrial size was normal in size. Pericardium: There is no evidence of pericardial effusion. Presence of epicardial fat layer. Mitral Valve: The mitral valve is normal in structure. Severe mitral annular calcification. No evidence of mitral valve regurgitation. Mild mitral valve stenosis. The mean mitral valve gradient is 3.3 mmHg with average heart rate of  78 bpm. Tricuspid Valve: The tricuspid valve is normal in structure. Tricuspid valve regurgitation is not demonstrated. No evidence of tricuspid stenosis. Aortic Valve: The aortic valve is abnormal. There is severe calcifcation of the aortic valve. Aortic valve regurgitation is trivial. Aortic valve mean gradient measures 20.0 mmHg. Aortic valve peak gradient measures 37.9 mmHg. SVI 31, DI 0.52. Aortic valve area, by VTI measures 1.24 cm. 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  Pulmonic Valve: The pulmonic valve was normal in structure. Pulmonic valve regurgitation is not visualized. No evidence of pulmonic stenosis. Aorta: The aortic root and ascending aorta are structurally normal, with no evidence of dilitation. Venous: The inferior vena cava is normal in size with greater than 50% respiratory variability, suggesting right atrial pressure of 3 mmHg. IAS/Shunts: No atrial level shunt detected by color flow Doppler.  LEFT VENTRICLE PLAX 2D LVIDd:         4.25 cm   Diastology LVIDs:         2.38 cm   LV e' medial:    7.94 cm/s LV PW:         1.18 cm   LV E/e' medial:  15.5 LV IVS:        1.23 cm   LV e' lateral:   9.36 cm/s LVOT diam:     1.75 cm   LV E/e' lateral: 13.1 LV SV:         71 LV SV Index:   31 LVOT Area:     2.41 cm  RIGHT VENTRICLE             IVC RV Basal diam:  3.23 cm     IVC diam: 1.67 cm RV Mid diam:    2.66 cm RV S prime:     16.80 cm/s TAPSE (M-mode): 3.0 cm LEFT ATRIUM             Index        RIGHT ATRIUM           Index LA diam:        3.76 cm 1.63 cm/m   RA Area:     12.80 cm LA Vol (A2C):   62.5 ml 27.15 ml/m  RA Volume:   25.80 ml  11.21 ml/m LA Vol (A4C):   46.8 ml 20.33 ml/m LA Biplane Vol: 55.5 ml 24.11 ml/m  AORTIC  VALVE AV Area (Vmax):    1.12 cm AV Area (Vmean):   1.26 cm AV Area (VTI):     1.24 cm AV Vmax:           308.00 cm/s AV Vmean:          186.000 cm/s AV VTI:            0.570 m AV Peak Grad:      37.9 mmHg AV Mean Grad:      20.0  mmHg LVOT Vmax:         143.00 cm/s LVOT Vmean:        97.150 cm/s LVOT VTI:          0.294 m LVOT/AV VTI ratio: 0.52  AORTA Ao Root diam: 2.61 cm Ao Asc diam:  3.40 cm MITRAL VALVE MV Area (PHT): 3.37 cm     SHUNTS MV Mean grad:  3.3 mmHg     Systemic VTI:  0.29 m MV Decel Time: 225 msec     Systemic Diam: 1.75 cm MV E velocity: 123.00 cm/s MV A velocity: 129.00 cm/s MV E/A ratio:  0.95 Emeline Calender Electronically signed by Emeline Calender Signature Date/Time: 01/16/2024/11:35:48 AM    Final    CT Head Wo Contrast Result Date: 01/15/2024 CLINICAL DATA:  Recent syncopal episode EXAM: CT HEAD WITHOUT CONTRAST TECHNIQUE: Contiguous axial images were obtained from the base of the skull through the vertex without intravenous contrast. RADIATION DOSE REDUCTION: This exam was performed according to the departmental dose-optimization program which includes automated exposure control, adjustment of the mA and/or kV according to patient size and/or use of iterative reconstruction technique. COMPARISON:  None Available. FINDINGS: Brain: No evidence of acute infarction, hemorrhage, hydrocephalus, extra-axial collection or mass lesion/mass effect. Mild atrophic changes and chronic white matter ischemic changes are seen. Vascular: No hyperdense vessel or unexpected calcification. Skull: Normal. Negative for fracture or focal lesion. Sinuses/Orbits: No acute finding. Other: Mild soft tissue swelling is noted posteriorly related to the recent injury. IMPRESSION: Chronic atrophic and ischemic changes. Soft tissue swelling consistent with the recent injury. Electronically Signed   By: Oneil Devonshire M.D.   On: 01/15/2024 15:25    Microbiology: Results for orders placed or performed during the hospital encounter of 06/30/18  Culture, sputum-assessment     Status: None   Collection Time: 07/01/18 12:55 PM   Specimen: Sputum  Result Value Ref Range Status   Specimen Description SPUTUM  Final   Special Requests NONE  Final    Sputum evaluation   Final    Sputum specimen not acceptable for testing.  Please recollect.   RESULT CALLED TO, READ BACK BY AND VERIFIED WITH: RN MC PHERSON ANDRE AT 1325 ON 4 1 2020 BY MHOUEGNIFIO Performed at [facility], [address], [place], [place] [address]    Report Status 07/01/2018 FINAL  Final    Labs: CBC: Recent Labs  Lab 01/15/24 1426 01/16/24 0447  WBC 6.3 8.8  HGB 13.1 12.6*  HCT 39.7 37.9*  MCV 86.9 85.4  PLT 179 163   Basic Metabolic Panel: Recent Labs  Lab 01/15/24 1426 01/16/24 0447  NA 136 136  K 4.1 3.6  CL 103 104  CO2 20* 23  GLUCOSE 150* 101*  BUN 17 16  CREATININE 1.98* 1.71*  CALCIUM 8.8* 8.8*   Liver Function Tests: Recent Labs  Lab 01/15/24 1426  AST 24  ALT 12  ALKPHOS 42  BILITOT 0.7  PROT 6.4*  ALBUMIN 3.5   CBG: Recent Labs  Lab 01/15/24 1433 01/16/24 0743  GLUCAP 147* 116*    Discharge time spent: less than 30 minutes.  Signed: Garnette Pelt, MD [facility] 01/16/2024

## 2024-01-16 NOTE — Consult Note (Signed)
 Cardiology Consultation   Patient ID: [name] MRN: [id number]; DOB: 06-06-1940  Admit date: 01/15/2024 Date of Consult: 01/16/2024  PCP:  [facility], [facility] Providers Cardiologist:  None   Patient Profile: Larry Liu is a 83 y.o. male with a hx of type 2 diabetes, CKD, hypertension, hyperlipidemia, BPH who is being seen 01/16/2024 for the evaluation of syncope and aortic stenosis at the request of Dr. Tobie.  History of Present Illness: [name] has no prior cardiac history.  He has no family history of CAD.  Former smoker of over 20+ years but has quit over 15+ years ago.  Currently patient being evaluated for an episode of syncope and new aortic stenosis.  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  Noted be hypotensive with AKI on arrival and given IV fluids with improvement.  BP meds have been held.  He was eating at a restaurant and was walking to the counter to pay his bill and suddenly lost consciousness.  He did not report any prodrome and reports the next thing he remembers was people surrounding him.  Has not had any other acute complaints such as chest pain, shortness of breath, palpitations, peripheral edema, orthopnea.  He reports he had another event approximately 1 year ago with the exact same scenario.  At the same restaurant and had suddenly lost consciousness.  He does describe worsening shortness of breath with exertion going on for the past year.  Used to walk 2-3 times around his block but now he struggles to complete 1 block.  However he still tries to do this at least once a week but not very active otherwise.  Blood pressure has improved from 90 systolic to 130s+.  Troponins negative x 2.  Potassium 3.6.  Creatinine 1.7 improved from 1.98 on admission.  Hemoglobin 12.6.  CT of the head negative.  Past Medical History:  Diagnosis Date   Diabetes mellitus without  complication (HCC)    High cholesterol    Hypertension    Obesity     Past Surgical History:  Procedure Laterality Date   REPLACEMENT TOTAL KNEE BILATERAL     Scheduled Meds:  DULoxetine  30 mg Oral Daily   enoxaparin  (LOVENOX ) injection  40 mg Subcutaneous Q24H   rosuvastatin  20 mg Oral Daily   sodium chloride  flush  3 mL Intravenous Q12H   Continuous Infusions:  lactated ringers 75 mL/hr at 01/16/24 1008   PRN Meds: acetaminophen  **OR** acetaminophen , ondansetron  **OR** ondansetron  (ZOFRAN ) IV, senna-docusate  Allergies:    Allergies  Allergen Reactions   Lisinopril Cough, Other (See Comments), Rash and Dermatitis    Other Reaction(s): Eruption, OTHER REACTION   Hydrocodone  Nausea And Vomiting    Social History:   Social History   Socioeconomic History   Marital status: Married    Spouse name: Not on file   Number of children: Not on file   Years of education: Not on file   Highest education level: Not on file  Occupational History   Not on file  Tobacco Use   Smoking status: Former   Smokeless tobacco: Never  Substance and Sexual Activity   Alcohol use: Yes    Comment: occ   Drug use: No   Sexual activity: Not on file  Other Topics Concern   Not on file  Social History Narrative   Not on file   Social Drivers of Health   Financial Resource Strain: Not  on file  Food Insecurity: Low Risk  (07/29/2023)   Received from [facility]   Hunger Vital Sign    Within the past 12 months, you worried that your food would run out before you got money to buy more: Never true    Within the past 12 months, the food you bought just didn't last and you didn't have money to get more. : Never true  Transportation Needs: No Transportation Needs (07/29/2023)   Received from [facility]    In the past 12 months, has lack of reliable transportation kept you from medical appointments, meetings, work or from getting things needed for daily living? : No   Physical Activity: Not on file  Stress: Not on file  Social Connections: Unknown (09/27/2021)   Received [facility]   Social Network    Social Network: Not on file  Intimate Partner Violence: Unknown (09/27/2021)   Received from [facility]   HITS    Physically Hurt: Not on file    Insult or Talk Down To: Not on file    Threaten Physical Harm: Not on file    Scream or Curse: Not on file    Family History:   Family History  Problem Relation Age of Onset   Stroke Mother    Stroke Father      ROS:  Please see the history of present illness.  All other ROS reviewed and negative.     Physical Exam/Data: Vitals:   01/16/24 0735 01/16/24 0737 01/16/24 1100 01/16/24 1103  BP:  133/78 133/89   Pulse:  75 71   Resp:  15 17   Temp:    98.5 F (36.9 C)  TempSrc:    Oral  SpO2: 97% 100% 100%   Weight:      Height:        Intake/Output Summary (Last 24 hours) at 01/16/2024 1232 Last data filed at 01/16/2024 1006 Gross per 24 hour  Intake 1003 ml  Output --  Net 1003 ml      01/15/2024    2:25 PM 04/10/2023   10:10 AM 06/30/2018    5:11 PM  Last 3 Weights  Weight (lbs) 240 lb 265 lb 310 lb  Weight (kg) 108.863 kg 120.203 kg 140.615 kg     Body mass index is 32.55 kg/m.  General:  Well nourished, well developed, in no acute distress HEENT: normal Neck: no JVD Vascular: No carotid bruits; Distal pulses 2+ bilaterally Cardiac:  normal S1, S2; RRR; 3-6 murmur best heard RSB Lungs:  clear to auscultation bilaterally, no wheezing, rhonchi or rales  Abd: soft, nontender, no hepatomegaly  Ext: no edema Musculoskeletal:  No deformities, BUE and BLE strength normal and equal Skin: warm and dry  Neuro:  CNs 2-12 intact, no focal abnormalities noted Psych:  Normal affect   EKG:  The EKG was personally reviewed and demonstrates: Sinus rhythm heart rate 75.  Inferolateral ST downsloping that appears new.  Other nonspecific changes. Telemetry:  Telemetry was personally  reviewed and demonstrates: Sinus rhythm heart rate 70-80  Relevant CV Studies: Echocardiogram 01/16/2024  1. Left ventricular ejection fraction, by estimation, is 55 to 60%. The  left ventricle has normal function. The left ventricle has no regional  wall motion abnormalities. Left ventricular diastolic parameters are  indeterminate.   2. Right ventricular systolic function is normal. The right ventricular  size is normal.   3. Left atrial size was mildly dilated.   4. The  mitral valve is normal in structure. No evidence of mitral valve  regurgitation. Mild mitral stenosis. The mean mitral valve gradient is 3.3  mmHg with average heart rate of 78 bpm. Severe mitral annular  calcification.   5. The aortic valve is abnormal. There is severe calcifcation of the  aortic valve. Aortic valve regurgitation is trivial. Likely moderate to  severe paradoxic low flow-low gradient aortic stenosis.   6. The inferior vena cava is normal in size with greater than 50%  respiratory variability, suggesting right atrial pressure of 3 mmHg.   Comparison(s): No prior Echocardiogram.   Conclusion(s)/Recommendation(s): Valvular findings as outlined below. Due  to the above findings, cardiology consult is recommended.   Laboratory Data: High Sensitivity Troponin:   Recent Labs  Lab 01/15/24 1504 01/15/24 1704  TROPONINIHS 11 9     Chemistry Recent Labs  Lab 01/15/24 1426 01/16/24 0447  NA 136 136  K 4.1 3.6  CL 103 104  CO2 20* 23  GLUCOSE 150* 101*  BUN 17 16  CREATININE 1.98* 1.71*  CALCIUM 8.8* 8.8*  GFRNONAA 33* 39*  ANIONGAP 13 9    Recent Labs  Lab 01/15/24 1426  PROT 6.4*  ALBUMIN 3.5  AST 24  ALT 12  ALKPHOS 42  BILITOT 0.7   Lipids No results for input(s): CHOL, TRIG, HDL, LABVLDL, LDLCALC, CHOLHDL in the last 168 hours.  Hematology Recent Labs  Lab 01/15/24 1426 01/16/24 0447  WBC 6.3 8.8  RBC 4.57 4.44  HGB 13.1 12.6*  HCT 39.7 37.9*  MCV 86.9 85.4   MCH 28.7 28.4  MCHC 33.0 33.2  RDW 15.3 15.4  PLT 179 163   Thyroid No results for input(s): TSH, FREET4 in the last 168 hours.  BNPNo results for input(s): BNP, PROBNP in the last 168 hours.  DDimer No results for input(s): DDIMER in the last 168 hours.  Radiology/Studies:  ECHOCARDIOGRAM COMPLETE Result Date: 01/16/2024    ECHOCARDIOGRAM REPORT   Patient Name:   Larry Liu Date of Exam: 01/16/2024 Medical Rec #:  [id number]      Height:       72.0 in Accession #:    [id number]     Weight:       240.0 lb Date of Birth:  1940/05/08      BSA:          2.302 m Patient Age:    83 years       BP:           156/99 mmHg Patient Gender: M              HR:           67 bpm. Exam Location:  Inpatient Procedure: 2D Echo, Cardiac Doppler and Color Doppler (Both Spectral and Color            Flow Doppler were utilized during procedure). Indications:    Syncope  History:        Patient has no prior history of Echocardiogram examinations.                 Risk Factors:Hypertension and Diabetes.  Sonographer:    Philomena Daring Referring Phys: [id number] VISHAL R PATEL IMPRESSIONS  1. Left ventricular ejection fraction, by estimation, is 55 to 60%. The left ventricle has normal function. The left ventricle has no regional wall motion abnormalities. Left ventricular diastolic parameters are indeterminate.  2. Right ventricular systolic function is normal. The right ventricular size is normal.  3. Left atrial size was mildly dilated.  4. The mitral valve is normal in structure. No evidence of mitral valve regurgitation. Mild mitral stenosis. The mean mitral valve gradient is 3.3 mmHg with average heart rate of 78 bpm. Severe mitral annular calcification.  5. The aortic valve is abnormal. There is severe calcifcation of the aortic valve. Aortic valve regurgitation is trivial. 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  6. The inferior vena cava is normal in size with greater than  50% respiratory variability, suggesting right atrial pressure of 3 mmHg. Comparison(s): No prior Echocardiogram. Conclusion(s)/Recommendation(s): Valvular findings as outlined below. Due to the above findings, cardiology consult is recommended. FINDINGS  Left Ventricle: Left ventricular ejection fraction, by estimation, is 55 to 60%. The left ventricle has normal function. The left ventricle has no regional wall motion abnormalities. The left ventricular internal cavity size was normal in size. There is  no left ventricular hypertrophy. Left ventricular diastolic parameters are indeterminate. Right Ventricle: The right ventricular size is normal. No increase in right ventricular wall thickness. Right ventricular systolic function is normal. Left [facility]: Left atrial size was mildly dilated. Right [facility]: Right atrial size was normal in size. Pericardium: There is no evidence of pericardial effusion. Presence of epicardial fat layer. Mitral Valve: The mitral valve is normal in structure. Severe mitral annular calcification. No evidence of mitral valve regurgitation. Mild mitral valve stenosis. The mean mitral valve gradient is 3.3 mmHg with average heart rate of 78 bpm. Tricuspid Valve: The tricuspid valve is normal in structure. Tricuspid valve regurgitation is not demonstrated. No evidence of tricuspid stenosis. Aortic Valve: The aortic valve is abnormal. There is severe calcifcation of the aortic valve. Aortic valve regurgitation is trivial. Aortic valve mean gradient measures 20.0 mmHg. Aortic valve peak gradient measures 37.9 mmHg. SVI 31, DI 0.52. Aortic valve area, by VTI measures 1.24 cm. 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  Pulmonic Valve: The pulmonic valve was normal in structure. Pulmonic valve regurgitation is not visualized. No evidence of pulmonic stenosis. Aorta: The aortic root and ascending aorta are structurally normal, with no evidence of dilitation. Venous:  The inferior vena cava is normal in size with greater than 50% respiratory variability, suggesting right atrial pressure of 3 mmHg. IAS/Shunts: No atrial level shunt detected by color flow Doppler.  LEFT VENTRICLE PLAX 2D LVIDd:         4.25 cm   Diastology LVIDs:         2.38 cm   LV e' medial:    7.94 cm/s LV PW:         1.18 cm   LV E/e' medial:  15.5 LV IVS:        1.23 cm   LV e' lateral:   9.36 cm/s LVOT diam:     1.75 cm   LV E/e' lateral: 13.1 LV SV:         71 LV SV Index:   31 LVOT Area:     2.41 cm  RIGHT VENTRICLE             IVC RV Basal diam:  3.23 cm     IVC diam: 1.67 cm RV Mid diam:    2.66 cm RV S prime:     16.80 cm/s TAPSE (M-mode): 3.0 cm LEFT ATRIUM             Index        RIGHT ATRIUM  Index LA diam:        3.76 cm 1.63 cm/m   RA Area:     12.80 cm LA Vol (A2C):   62.5 ml 27.15 ml/m  RA Volume:   25.80 ml  11.21 ml/m LA Vol (A4C):   46.8 ml 20.33 ml/m LA Biplane Vol: 55.5 ml 24.11 ml/m  AORTIC VALVE AV Area (Vmax):    1.12 cm AV Area (Vmean):   1.26 cm AV Area (VTI):     1.24 cm AV Vmax:           308.00 cm/s AV Vmean:          186.000 cm/s AV VTI:            0.570 m AV Peak Grad:      37.9 mmHg AV Mean Grad:      20.0 mmHg LVOT Vmax:         143.00 cm/s LVOT Vmean:        97.150 cm/s LVOT VTI:          0.294 m LVOT/AV VTI ratio: 0.52  AORTA Ao Root diam: 2.61 cm Ao Asc diam:  3.40 cm MITRAL VALVE MV Area (PHT): 3.37 cm     SHUNTS MV Mean grad:  3.3 mmHg     Systemic VTI:  0.29 m MV Decel Time: 225 msec     Systemic Diam: 1.75 cm MV E velocity: 123.00 cm/s MV A velocity: 129.00 cm/s MV E/A ratio:  0.95 Emeline Calender Electronically signed by Emeline Calender Signature Date/Time: 01/16/2024/11:35:48 AM    Final    CT Head Wo Contrast Result Date: 01/15/2024 CLINICAL DATA:  Recent syncopal episode EXAM: CT HEAD WITHOUT CONTRAST TECHNIQUE: Contiguous axial images were obtained from the base of the skull through the vertex without intravenous contrast. RADIATION DOSE REDUCTION:  This exam was performed according to the departmental dose-optimization program which includes automated exposure control, adjustment of the mA and/or kV according to patient size and/or use of iterative reconstruction technique. COMPARISON:  None Available. FINDINGS: Brain: No evidence of acute infarction, hemorrhage, hydrocephalus, extra-axial collection or mass lesion/mass effect. Mild atrophic changes and chronic white matter ischemic changes are seen. Vascular: No hyperdense vessel or unexpected calcification. Skull: Normal. Negative for fracture or focal lesion. Sinuses/Orbits: No acute finding. Other: Mild soft tissue swelling is noted posteriorly related to the recent injury. IMPRESSION: Chronic atrophic and ischemic changes. Soft tissue swelling consistent with the recent injury. Electronically Signed   By: Oneil Devonshire M.D.   On: 01/15/2024 15:25     Assessment and Plan:  Syncope New aortic stenosis Patient reports sudden loss of consciousness after getting up to pay for his check.  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  Presentation likely exacerbated by AKI/dehydration.  Did have positive orthostatics. We will schedule appointment to see structural heart for consideration of possible TAVR. Overall healthy individual without any significant comorbidities.  I think he would be a reasonable candidate. Continue to hydrate.  BP meds still on hold (losartan and atenolol) probably could slowly start adding back.  Discussed with him the need for adequate fluid hydration and close monitoring of symptoms and to avoid dehydration. Low suspicion for arrhythmia induced syncope however will need to rule this out.  Will discharge on 2 week heart monitor. Likely can proceed with right and left  heart  catheterization outpatient.  This will also give him time to recover from his mild AKI.   Risk Assessment/Risk Scores:   For questions or updates, please contact [facility] Please consult [email or web address] for contact info under   Signed, Thom LITTIE Sluder, PA-C  01/16/2024 12:32 PM

## 2024-01-16 NOTE — Telephone Encounter (Signed)
 Error

## 2024-01-19 NOTE — Progress Notes (Addendum)
 Patient ID: Larry Liu MRN: [id number] DOB/AGE: December 17, 1940 83 y.o.  Primary Care Physician:[facility], [facility] Primary Cardiologist: Jordan  CC:  Aortic valvular disease management     FOCUSED PROBLEM LIST:   Aortic stenosis AVA 1.24, SVI 31, DI 0.52, MG 20, EF 55 to 60% TTE October 2025 EKG normal sinus rhythm with LVH T2DM Not on insulin  Hyperlipidemia Hypertension CKD stage IIIb BSA 2.3/BMI 43.08 January 2024:  Patient consents to use of AI scribe. The patient is a 83 year old male with above listed medical problems who is referred for recommendations regarding his aortic valvular disease.  The patient was admitted to the hospital after syncopal episode.  An echocardiogram demonstrated paradoxical low-flow low gradient aortic stenosis.  His course was also showed associated with AKI.  He was previously hospitalized due to a syncope episode, during which an echocardiogram revealed a worn-out aortic valve. Since returning home, he has not experienced any further syncope.  He experiences shortness of breath, particularly during activities such as running to the car. If he speeds up, he becomes fatigued. No chest pain, nausea, or abdominal pain associated with these episodes. No leg swelling and he prefers to sleep flat on one pillow.  There has been a significant decrease in his physical activity over the past year, having stopped going to the gym due to fatigue. His daily activities are limited to making coffee, washing dishes, taking out the trash, and having breakfast with friends. He experiences shortness of breath about once a week when exerting himself.  He has no dental issues.        Past Medical History:  Diagnosis Date   Diabetes mellitus without complication (HCC)    High cholesterol    Hypertension    Obesity     Past Surgical History:  Procedure Laterality Date   REPLACEMENT TOTAL KNEE BILATERAL      Family History  Problem Relation Age of Onset    Stroke Mother    Stroke Father     Social History   Socioeconomic History   Marital status: Married    Spouse name: Not on file   Number of children: Not on file   Years of education: Not on file   Highest education level: Not on file  Occupational History   Not on file  Tobacco Use   Smoking status: Former   Smokeless tobacco: Never  Substance and Sexual Activity   Alcohol use: Yes    Comment: occ   Drug use: No   Sexual activity: Not on file  Other Topics Concern   Not on file  Social History Narrative   Not on file   Social Drivers of Health   Financial Resource Strain: Not on file  Food Insecurity: Low Risk  (07/29/2023)   Received from [facility]   Hunger Vital Sign    Within the past 12 months, you worried that your food would run out before you got money to buy more: Never true    Within the past 12 months, the food you bought just didn't last and you didn't have money to get more. : Never true  Transportation Needs: No Transportation Needs (07/29/2023)   Received from [facility]    In the past 12 months, has lack of reliable transportation kept you from medical appointments, meetings, work or from getting things needed for daily living? : No  Physical Activity: Not on file  Stress: Not on file  Social Connections: Socially Integrated (  01/16/2024)   Social Connection and Isolation Panel    Frequency of Communication with Friends and Family: More than three times a week    Frequency of Social Gatherings with Friends and Family: More than three times a week    Attends Religious Services: 1 to 4 times per year    Active Member of [employer] or Organizations: Yes    Attends Banker Meetings: More than 4 times per year    Marital Status: Married  Catering Manager Violence: Unknown (09/27/2021)   Received from [facility]   HITS    Physically Hurt: Not on file    Insult or Talk Down To: Not on file    Threaten Physical Harm: Not  on file    Scream or Curse: Not on file     Prior to Admission medications   Medication Sig Start Date End Date Taking? Authorizing Provider  amoxicillin (AMOXIL) 500 MG capsule Take 2,000 mg by mouth See admin instructions. Take 2,000 mg by mouth one hour prior to dental procedures    [provider]  aspirin EC 81 MG tablet Take 81 mg by mouth daily. 12/08/12   [provider]  atenolol (TENORMIN) 25 MG tablet Take 1 tablet (25 mg total) by mouth daily. 01/16/24   Cindy Garnette POUR, MD  DULoxetine (CYMBALTA) 30 MG capsule Take 30 mg by mouth daily. 02/12/23   [provider]  fluticasone (FLONASE) 50 MCG/ACT nasal spray Place 2 sprays into the nose daily. 12/08/12   [provider]  glipiZIDE (GLUCOTROL) 10 MG tablet Take 10 mg by mouth daily before breakfast. 03/01/21   [provider]  latanoprost  (XALATAN ) 0.005 % ophthalmic solution Place 1 drop into both eyes daily.    [provider]  losartan (COZAAR) 100 MG tablet Take 100 mg by mouth daily.    [provider]  naloxone [facility]) nasal spray 4 mg/0.1 mL Place into the nose. 07/30/23   [provider]  rosuvastatin (CRESTOR) 40 MG tablet Take 20 mg by mouth daily. 02/12/23   [provider]  tamsulosin (FLOMAX) 0.4 MG CAPS capsule Take 0.4 mg by mouth at bedtime. 02/12/23   [provider]  zolpidem  (AMBIEN ) 10 MG tablet Take 10 mg by mouth at bedtime.    [provider]    Allergies  Allergen Reactions   Lisinopril Cough, Other (See Comments), Rash and Dermatitis    Other Reaction(s): Eruption, OTHER REACTION   Hydrocodone  Nausea And Vomiting    REVIEW OF SYSTEMS:  General: no fevers/chills/night sweats Eyes: no blurry vision, diplopia, or amaurosis ENT: no sore throat or hearing loss Resp: no cough, wheezing, or hemoptysis CV: no edema or palpitations GI: no abdominal pain, nausea, vomiting, diarrhea, or constipation GU: no dysuria,  frequency, or hematuria Skin: no rash Neuro: no headache, numbness, tingling, or weakness of extremities Musculoskeletal: no joint pain or swelling Heme: no bleeding, DVT, or easy bruising Endo: no polydipsia or polyuria  BP 110/70   Pulse 87   Ht 6' (1.829 m)   Wt 258 lb 12.8 oz (117.4 kg)   SpO2 95%   BMI 35.10 kg/m   PHYSICAL EXAM: GEN:  AO x 3 in no acute distress HEENT: normal Dentition: Normal Neck: JVP normal. +2 carotid upstrokes without bruits. No thyromegaly. Lungs: equal expansion, clear bilaterally CV: Apex is discrete and nondisplaced, RRR with 3/6 SEM Abd: soft, non-tender, non-distended; no bruit; positive bowel sounds Ext: no edema, ecchymoses, or cyanosis Vascular: 2+  femoral pulses, 2+ radial pulses       Skin: warm and dry without rash Neuro: CN II-XII grossly intact; motor and sensory grossly intact    DATA AND STUDIES:  EKG: 2025 normal sinus rhythm with LVH  EKG Interpretation Date/Time:    Ventricular Rate:    PR Interval:    QRS Duration:    QT Interval:    QTC Calculation:   R Axis:      Text Interpretation:          CARDIAC STUDIES: Refer to CV Procedures and Imaging Tabs  04/10/2023: B Natriuretic Peptide 60.1 01/15/2024: ALT 12 01/16/2024: BUN 16; Creatinine, Ser 1.71; Hemoglobin 12.6; Platelets 163; Potassium 3.6; Sodium 136   STS RISK CALCULATOR: Pending  NYHA CLASS: 2    ASSESSMENT AND PLAN:   1. Nonrheumatic aortic valve stenosis   2. Type 2 diabetes mellitus with complication, without long-term current use of insulin  (HCC)   3. Hyperlipidemia LDL goal <70   4. Primary hypertension   5. Stage 3b chronic kidney disease (HCC)   6. BMI 40.0-44.9, adult [facility])     Aortic stenosis: The patient has developed NYHA class II symptoms of shortness of breath and fatigue.  Additionally he has developed syncope of unknown origin.  I think it best to assume that this is due to his aortic valvular disease.  I had a long talk with  the patient and his wife and he would like to pursue an evaluation for aortic valve intervention.  We will refer the patient for coronary angiography and right heart catheterization with Dr. Jordan, CTA, and cardiothoracic surgical opinion. T2DM: Continue aspirin 81 mg, losartan 100 mg, rosuvastatin 40 mg Hyperlipidemia: Continue rosuvastatin 40 mg Hypertension: Continue atenolol 25 mg, losartan 100 mg CKD stage IIIb: Continue losartan 100 mg Elevated BMI: His BMI and BSA are elevated; he is relatively sedentary.  A large valve may not be necessary in this particular patient.   I have personally reviewed the patients imaging data as summarized above.  I have reviewed the natural history of aortic stenosis with the patient and family members who are present today. We have discussed the limitations of medical therapy and the poor prognosis associated with symptomatic aortic stenosis. We have also reviewed potential treatment options, including palliative medical therapy, conventional surgical aortic valve replacement, and transcatheter aortic valve replacement. We discussed treatment options in the context of this patient's specific comorbid medical conditions.   All of the patient's questions were answered today. Will make further recommendations based on the results of studies outlined above.   I spent 45 minutes reviewing all clinical data during and prior to this visit including all relevant imaging studies, laboratories, clinical information from other health systems and prior notes from both Cardiology and other specialties, interviewing the patient, conducting a complete physical examination, and coordinating care in order to formulate a comprehensive and personalized evaluation and treatment plan.   Larry Marzella K Harinder Romas, MD  01/21/2024 2:28 PM    [facility] [address] [place], [place], [place]  [address] Phone: [phone]; Fax: [phone]

## 2024-01-19 NOTE — H&P (View-Only) (Signed)
 Patient ID: Larry Liu MRN: [id number] DOB/AGE: 1940/12/16 83 y.o.  Primary Care Physician:[facility], [facility] Primary Cardiologist: [place]  CC:  Aortic valvular disease management     FOCUSED PROBLEM LIST:   Aortic stenosis AVA 1.24, SVI 31, DI 0.52, MG 20, EF 55 to 60% TTE October 2025 EKG normal sinus rhythm with LVH T2DM Not on insulin  Hyperlipidemia Hypertension CKD stage IIIb BSA 2.3/BMI 43.08 January 2024:  Patient consents to use of AI scribe. The patient is a 83 year old male with above listed medical problems who is referred for recommendations regarding his aortic valvular disease.  The patient was admitted to the hospital after syncopal episode.  An echocardiogram demonstrated paradoxical low-flow low gradient aortic stenosis.  His course was also showed associated with AKI.  He was previously hospitalized due to a syncope episode, during which an echocardiogram revealed a worn-out aortic valve. Since returning home, he has not experienced any further syncope.  He experiences shortness of breath, particularly during activities such as running to the car. If he speeds up, he becomes fatigued. No chest pain, nausea, or abdominal pain associated with these episodes. No leg swelling and he prefers to sleep flat on one pillow.  There has been a significant decrease in his physical activity over the past year, having stopped going to the gym due to fatigue. His daily activities are limited to making coffee, washing dishes, taking out the trash, and having breakfast with friends. He experiences shortness of breath about once a week when exerting himself.  He has no dental issues.        Past Medical History:  Diagnosis Date   Diabetes mellitus without complication (HCC)    High cholesterol    Hypertension    Obesity     Past Surgical History:  Procedure Laterality Date   REPLACEMENT TOTAL KNEE BILATERAL      Family History  Problem Relation Age of Onset    Stroke Mother    Stroke Father     Social History   Socioeconomic History   Marital status: Married    Spouse name: Not on file   Number of children: Not on file   Years of education: Not on file   Highest education level: Not on file  Occupational History   Not on file  Tobacco Use   Smoking status: Former   Smokeless tobacco: Never  Substance and Sexual Activity   Alcohol use: Yes    Comment: occ   Drug use: No   Sexual activity: Not on file  Other Topics Concern   Not on file  Social History Narrative   Not on file   Social Drivers of Health   Financial Resource Strain: Not on file  Food Insecurity: Low Risk  (07/29/2023)   Received from [facility]   Hunger Vital Sign    Within the past 12 months, you worried that your food would run out before you got money to buy more: Never true    Within the past 12 months, the food you bought just didn't last and you didn't have money to get more. : Never true  Transportation Needs: No Transportation Needs (07/29/2023)   Received from [facility]    In the past 12 months, has lack of reliable transportation kept you from medical appointments, meetings, work or from getting things needed for daily living? : No  Physical Activity: Not on file  Stress: Not on file  Social Connections: Socially Integrated (  01/16/2024)   Social Connection and Isolation Panel    Frequency of Communication with Friends and Family: More than three times a week    Frequency of Social Gatherings with Friends and Family: More than three times a week    Attends Religious Services: 1 to 4 times per year    Active Member of [employer] or Organizations: Yes    Attends Banker Meetings: More than 4 times per year    Marital Status: Married  Catering manager Violence: Unknown (09/27/2021)   Received from [facility]   HITS    Physically Hurt: Not on file    Insult or Talk Down To: Not on file    Threaten Physical Harm: Not  on file    Scream or Curse: Not on file     Prior to Admission medications   Medication Sig Start Date End Date Taking? Authorizing Provider  amoxicillin (AMOXIL) 500 MG capsule Take 2,000 mg by mouth See admin instructions. Take 2,000 mg by mouth one hour prior to dental procedures    [provider]  aspirin EC 81 MG tablet Take 81 mg by mouth daily. 12/08/12   [provider]  atenolol (TENORMIN) 25 MG tablet Take 1 tablet (25 mg total) by mouth daily. 01/16/24   Cindy Garnette POUR, MD  DULoxetine (CYMBALTA) 30 MG capsule Take 30 mg by mouth daily. 02/12/23   [provider]  fluticasone (FLONASE) 50 MCG/ACT nasal spray Place 2 sprays into the nose daily. 12/08/12   [provider]  glipiZIDE (GLUCOTROL) 10 MG tablet Take 10 mg by mouth daily before breakfast. 03/01/21   [provider]  latanoprost  (XALATAN ) 0.005 % ophthalmic solution Place 1 drop into both eyes daily.    [provider]  losartan (COZAAR) 100 MG tablet Take 100 mg by mouth daily.    [provider]  naloxone [facility]) nasal spray 4 mg/0.1 mL Place into the nose. 07/30/23   [provider]  rosuvastatin (CRESTOR) 40 MG tablet Take 20 mg by mouth daily. 02/12/23   [provider]  tamsulosin (FLOMAX) 0.4 MG CAPS capsule Take 0.4 mg by mouth at bedtime. 02/12/23   [provider]  zolpidem  (AMBIEN ) 10 MG tablet Take 10 mg by mouth at bedtime.    [provider]    Allergies  Allergen Reactions   Lisinopril Cough, Other (See Comments), Rash and Dermatitis    Other Reaction(s): Eruption, OTHER REACTION   Hydrocodone  Nausea And Vomiting    REVIEW OF SYSTEMS:  General: no fevers/chills/night sweats Eyes: no blurry vision, diplopia, or amaurosis ENT: no sore throat or hearing loss Resp: no cough, wheezing, or hemoptysis CV: no edema or palpitations GI: no abdominal pain, nausea, vomiting, diarrhea, or constipation GU: no dysuria,  frequency, or hematuria Skin: no rash Neuro: no headache, numbness, tingling, or weakness of extremities Musculoskeletal: no joint pain or swelling Heme: no bleeding, DVT, or easy bruising Endo: no polydipsia or polyuria  BP 110/70   Pulse 87   Ht 6' (1.829 m)   Wt 258 lb 12.8 oz (117.4 kg)   SpO2 95%   BMI 35.10 kg/m   PHYSICAL EXAM: GEN:  AO x 3 in no acute distress HEENT: normal Dentition: Normal Neck: JVP normal. +2 carotid upstrokes without bruits. No thyromegaly. Lungs: equal expansion, clear bilaterally CV: Apex is discrete and nondisplaced, RRR with 3/6 SEM Abd: soft, non-tender, non-distended; no bruit; positive bowel sounds Ext: no edema, ecchymoses, or cyanosis Vascular: 2+  femoral pulses, 2+ radial pulses       Skin: warm and dry without rash Neuro: CN II-XII grossly intact; motor and sensory grossly intact    DATA AND STUDIES:  EKG: 2025 normal sinus rhythm with LVH  EKG Interpretation Date/Time:    Ventricular Rate:    PR Interval:    QRS Duration:    QT Interval:    QTC Calculation:   R Axis:      Text Interpretation:          CARDIAC STUDIES: Refer to CV Procedures and Imaging Tabs  04/10/2023: B Natriuretic Peptide 60.1 01/15/2024: ALT 12 01/16/2024: BUN 16; Creatinine, Ser 1.71; Hemoglobin 12.6; Platelets 163; Potassium 3.6; Sodium 136   STS RISK CALCULATOR: Pending  NYHA CLASS: 2    ASSESSMENT AND PLAN:   1. Nonrheumatic aortic valve stenosis   2. Type 2 diabetes mellitus with complication, without long-term current use of insulin  (HCC)   3. Hyperlipidemia LDL goal <70   4. Primary hypertension   5. Stage 3b chronic kidney disease (HCC)   6. BMI 40.0-44.9, adult [facility])     Aortic stenosis: The patient has developed NYHA class II symptoms of shortness of breath and fatigue.  Additionally he has developed syncope of unknown origin.  I think it best to assume that this is due to his aortic valvular disease.  I had a long talk with  the patient and his wife and he would like to pursue an evaluation for aortic valve intervention.  We will refer the patient for coronary angiography and right heart catheterization with Dr. [place], CTA, and cardiothoracic surgical opinion. T2DM: Continue aspirin 81 mg, losartan 100 mg, rosuvastatin 40 mg Hyperlipidemia: Continue rosuvastatin 40 mg Hypertension: Continue atenolol 25 mg, losartan 100 mg CKD stage IIIb: Continue losartan 100 mg Elevated BMI: Is BMI and BSA are elevated; he is relatively sedentary.  A large valve may not be necessary in this particular patient.   I have personally reviewed the patients imaging data as summarized above.  I have reviewed the natural history of aortic stenosis with the patient and family members who are present today. We have discussed the limitations of medical therapy and the poor prognosis associated with symptomatic aortic stenosis. We have also reviewed potential treatment options, including palliative medical therapy, conventional surgical aortic valve replacement, and transcatheter aortic valve replacement. We discussed treatment options in the context of this patient's specific comorbid medical conditions.   All of the patient's questions were answered today. Will make further recommendations based on the results of studies outlined above.   I spent 45 minutes reviewing all clinical data during and prior to this visit including all relevant imaging studies, laboratories, clinical information from other health systems and prior notes from both Cardiology and other specialties, interviewing the patient, conducting a complete physical examination, and coordinating care in order to formulate a comprehensive and personalized evaluation and treatment plan.   Margrit Minner K Cahlil Sattar, MD  01/21/2024 2:28 PM    [facility] [address] [place], [place], [place]  [address] Phone: [phone]; Fax: [phone]

## 2024-01-21 ENCOUNTER — Encounter: Payer: Self-pay | Admitting: Internal Medicine

## 2024-01-21 ENCOUNTER — Ambulatory Visit: Attending: Cardiology | Admitting: Internal Medicine

## 2024-01-21 VITALS — BP 110/70 | HR 87 | Ht 72.0 in | Wt 258.8 lb

## 2024-01-21 DIAGNOSIS — E785 Hyperlipidemia, unspecified: Secondary | ICD-10-CM

## 2024-01-21 DIAGNOSIS — I350 Nonrheumatic aortic (valve) stenosis: Secondary | ICD-10-CM | POA: Diagnosis not present

## 2024-01-21 DIAGNOSIS — E118 Type 2 diabetes mellitus with unspecified complications: Secondary | ICD-10-CM

## 2024-01-21 DIAGNOSIS — N1832 Chronic kidney disease, stage 3b: Secondary | ICD-10-CM

## 2024-01-21 DIAGNOSIS — Z6841 Body Mass Index (BMI) 40.0 and over, adult: Secondary | ICD-10-CM

## 2024-01-21 DIAGNOSIS — I10 Essential (primary) hypertension: Secondary | ICD-10-CM

## 2024-01-21 NOTE — Patient Instructions (Signed)
 Medication Instructions:  No medication changes were made at this visit. Continue current regimen.   *If you need a refill on your cardiac medications before your next appointment, please call your pharmacy*  Lab Work: None ordered today. If you have labs (blood work) drawn today and your tests are completely normal, you will receive your results only by: MyChart Message (if you have MyChart) OR A paper copy in the mail If you have any lab test that is abnormal or we need to change your treatment, we will call you to review the results.  Testing/Procedures: None ordered today.  Follow-Up: At [facility], you and your health needs are our priority.  As part of our continuing mission to provide you with exceptional heart care, our providers are all part of one team.  This team includes your primary Cardiologist (physician) and Advanced Practice Providers or APPs (Physician Assistants and Nurse Practitioners) who all work together to provide you with the care you need, when you need it.  Your next appointment:   Per structural heart team  Provider:   Lurena Red, MD or Izetta Hummer, PA-C    We recommend signing up for the patient portal called MyChart.  Sign up information is provided on this After Visit Summary.  MyChart is used to connect with patients for Virtual Visits (Telemedicine).  Patients are able to view lab/test results, encounter notes, upcoming appointments, etc.  Non-urgent messages can be sent to your provider as well.   To learn more about what you can do with MyChart, go to [email or web address].   Other Instructions       Cardiac/Peripheral Catheterization   You are scheduled for a Cardiac Catheterization on Tuesday, November 4 with Dr. Lurena Red.  1. Please arrive at the [facility] (Main Entrance A) at [facility]: [address] [place], [place] [address] at 7:00 AM (This time is 2 hour(s) before your procedure to ensure your  preparation). Your procedure is scheduled to begin at 9 AM.  Free valet parking service is available. You will check in at ADMITTING. The support person will be asked to wait in the waiting room.  It is OK to have someone drop you off and come back when you are ready to be discharged.        Special note: Every effort is made to have your procedure done on time. Please understand that emergencies sometimes delay scheduled procedures.  2. Diet: Nothing to eat after midnight.  3. Hydration:You need to be well hydrated before your procedure. On November 4, you may drink approved liquids (see below) until 2 hours before the procedure, with 16 oz of water as your last intake.   List of approved liquids water, clear juice, clear tea, black coffee, fruit juices, non-citric and without pulp, carbonated beverages, Gatorade, Kool -Aid, plain Jello-O and plain ice popsicles.  4. Labs: You will not need to have additional blood work for this procedure.  5. Medication instructions in preparation for your procedure:   Contrast Allergy: No   Stop taking, Cozaar (Losartan) Monday, November 3,   Do not take Diabetes Med Glipizide on the day of the procedure and HOLD 48 HOURS AFTER THE PROCEDURE.  On the morning of your procedure, take Aspirin 81 mg and any morning medicines NOT listed above.  You may use sips of water.  6. Plan to go home the same day, you will only stay overnight if medically necessary. 7. You MUST have a responsible adult  to drive you home. 8. An adult MUST be with you the first 24 hours after you arrive home. 9. Bring a current list of your medications, and the last time and date medication taken. 10. Bring ID and current insurance cards. 11.Please wear clothes that are easy to get on and off and wear slip-on shoes.  Thank you for allowing us  to care for you!   -- [facility]

## 2024-01-27 ENCOUNTER — Telehealth: Payer: Self-pay | Admitting: *Deleted

## 2024-01-27 NOTE — Telephone Encounter (Signed)
 Cardiac Catheterization scheduled at [facility] for: Tuesday February 03, 2024 12 Noon Arrival time [facility] Main Entrance A at: 7 AM-pre-procedure hydration-per protocol GFR <45 (39)  Diet: -Nothing to eat after midnight.  Hydration: -May drink clear liquids until 2 hours before the procedure.  Approved liquids: Water, clear tea, black coffee, fruit juices-non-citric and without pulp,Gatorade, plain Jello/popsicles.  No PO hydration (16 oz water) 2 hours prior-going in early for IVF  Medication instructions: -Hold:  Losartan-day before and day of procedure-per protocol   Glipizide-AM of procedure -Other usual morning medications can be taken including aspirin 81 mg.  Plan to go home the same day, you will only stay overnight if medically necessary.  You must have responsible adult to drive you home.  Someone must be with you the first 24 hours after you arrive home.  Reviewed procedure instructions/discussed pre-procedure hydration with patient.

## 2024-02-03 ENCOUNTER — Encounter (HOSPITAL_COMMUNITY)
Admission: RE | Disposition: A | Discharge status: Home / Self Care | Payer: Self-pay | Source: Home / Self Care | Attending: Cardiology

## 2024-02-03 ENCOUNTER — Other Ambulatory Visit: Payer: Self-pay

## 2024-02-03 ENCOUNTER — Ambulatory Visit (HOSPITAL_COMMUNITY)
Admission: RE | Admit: 2024-02-03 | Discharge: 2024-02-03 | Disposition: A | Discharge status: Home / Self Care | Attending: Cardiology | Admitting: Cardiology

## 2024-02-03 DIAGNOSIS — E1122 Type 2 diabetes mellitus with diabetic chronic kidney disease: Secondary | ICD-10-CM | POA: Insufficient documentation

## 2024-02-03 DIAGNOSIS — N1832 Chronic kidney disease, stage 3b: Secondary | ICD-10-CM | POA: Insufficient documentation

## 2024-02-03 DIAGNOSIS — I358 Other nonrheumatic aortic valve disorders: Secondary | ICD-10-CM | POA: Diagnosis not present

## 2024-02-03 DIAGNOSIS — I350 Nonrheumatic aortic (valve) stenosis: Secondary | ICD-10-CM | POA: Diagnosis present

## 2024-02-03 DIAGNOSIS — K869 Disease of pancreas, unspecified: Secondary | ICD-10-CM | POA: Insufficient documentation

## 2024-02-03 DIAGNOSIS — Z7982 Long term (current) use of aspirin: Secondary | ICD-10-CM | POA: Insufficient documentation

## 2024-02-03 DIAGNOSIS — I129 Hypertensive chronic kidney disease with stage 1 through stage 4 chronic kidney disease, or unspecified chronic kidney disease: Secondary | ICD-10-CM | POA: Insufficient documentation

## 2024-02-03 DIAGNOSIS — Z7984 Long term (current) use of oral hypoglycemic drugs: Secondary | ICD-10-CM | POA: Diagnosis not present

## 2024-02-03 DIAGNOSIS — E042 Nontoxic multinodular goiter: Secondary | ICD-10-CM | POA: Insufficient documentation

## 2024-02-03 DIAGNOSIS — E785 Hyperlipidemia, unspecified: Secondary | ICD-10-CM | POA: Diagnosis not present

## 2024-02-03 DIAGNOSIS — R918 Other nonspecific abnormal finding of lung field: Secondary | ICD-10-CM | POA: Diagnosis not present

## 2024-02-03 DIAGNOSIS — Z79899 Other long term (current) drug therapy: Secondary | ICD-10-CM | POA: Insufficient documentation

## 2024-02-03 DIAGNOSIS — Z87891 Personal history of nicotine dependence: Secondary | ICD-10-CM | POA: Diagnosis not present

## 2024-02-03 HISTORY — PX: RIGHT/LEFT HEART CATH AND CORONARY ANGIOGRAPHY: CATH118266

## 2024-02-03 LAB — POCT I-STAT EG7
Acid-base deficit: 3 mmol/L — ABNORMAL HIGH (ref 0.0–2.0)
Acid-base deficit: 2 mmol/L (ref 0.0–2.0)
Acid-base deficit: 2 mmol/L (ref 0.0–2.0)
Bicarbonate: 22.2 mmol/L (ref 20.0–28.0)
Bicarbonate: 23.2 mmol/L (ref 20.0–28.0)
Bicarbonate: 23.8 mmol/L (ref 20.0–28.0)
Calcium, Ion: 1.21 mmol/L (ref 1.15–1.40)
Calcium, Ion: 1.23 mmol/L (ref 1.15–1.40)
Calcium, Ion: 1.25 mmol/L (ref 1.15–1.40)
HCT: 37 % — ABNORMAL LOW (ref 39.0–52.0)
HCT: 36 % — ABNORMAL LOW (ref 39.0–52.0)
HCT: 37 % — ABNORMAL LOW (ref 39.0–52.0)
Hemoglobin: 12.6 g/dL — ABNORMAL LOW (ref 13.0–17.0)
Hemoglobin: 12.2 g/dL — ABNORMAL LOW (ref 13.0–17.0)
Hemoglobin: 12.6 g/dL — ABNORMAL LOW (ref 13.0–17.0)
O2 Saturation: 69 %
O2 Saturation: 71 %
O2 Saturation: 75 %
Potassium: 4 mmol/L (ref 3.5–5.1)
Potassium: 4 mmol/L (ref 3.5–5.1)
Potassium: 4 mmol/L (ref 3.5–5.1)
Sodium: 139 mmol/L (ref 135–145)
Sodium: 140 mmol/L (ref 135–145)
Sodium: 140 mmol/L (ref 135–145)
TCO2: 23 mmol/L (ref 22–32)
TCO2: 24 mmol/L (ref 22–32)
TCO2: 25 mmol/L (ref 22–32)
pCO2, Ven: 41.2 mmHg — ABNORMAL LOW (ref 44–60)
pCO2, Ven: 42.1 mmHg — ABNORMAL LOW (ref 44–60)
pCO2, Ven: 42.9 mmHg — ABNORMAL LOW (ref 44–60)
pH, Ven: 7.34 (ref 7.25–7.43)
pH, Ven: 7.349 (ref 7.25–7.43)
pH, Ven: 7.352 (ref 7.25–7.43)
pO2, Ven: 38 mmHg (ref 32–45)
pO2, Ven: 39 mmHg (ref 32–45)
pO2, Ven: 42 mmHg (ref 32–45)

## 2024-02-03 LAB — POCT I-STAT 7, (LYTES, BLD GAS, ICA,H+H)
Acid-base deficit: 4 mmol/L — ABNORMAL HIGH (ref 0.0–2.0)
Bicarbonate: 21.2 mmol/L (ref 20.0–28.0)
Calcium, Ion: 1.2 mmol/L (ref 1.15–1.40)
HCT: 36 % — ABNORMAL LOW (ref 39.0–52.0)
Hemoglobin: 12.2 g/dL — ABNORMAL LOW (ref 13.0–17.0)
O2 Saturation: 96 %
Potassium: 4 mmol/L (ref 3.5–5.1)
Sodium: 140 mmol/L (ref 135–145)
TCO2: 22 mmol/L (ref 22–32)
pCO2 arterial: 37.1 mmHg (ref 32–48)
pH, Arterial: 7.364 (ref 7.35–7.45)
pO2, Arterial: 82 mmHg — ABNORMAL LOW (ref 83–108)

## 2024-02-03 LAB — GLUCOSE, CAPILLARY
Glucose-Capillary: 108 mg/dL — ABNORMAL HIGH (ref 70–99)
Glucose-Capillary: 86 mg/dL (ref 70–99)

## 2024-02-03 SURGERY — RIGHT/LEFT HEART CATH AND CORONARY ANGIOGRAPHY
Anesthesia: LOCAL

## 2024-02-03 MED ORDER — FENTANYL CITRATE (PF) 100 MCG/2ML IJ SOLN
INTRAMUSCULAR | Status: DC | PRN
  Administered 2024-02-03: 25 ug via INTRAVENOUS

## 2024-02-03 MED ORDER — ONDANSETRON HCL 4 MG/2ML IJ SOLN: 4.0000 mg | Freq: Four times a day (QID) | INTRAMUSCULAR | Status: DC | PRN

## 2024-02-03 MED ORDER — HEPARIN (PORCINE) IN NACL 1000-0.9 UT/500ML-% IV SOLN
INTRAVENOUS | Status: DC | PRN
  Administered 2024-02-03: 1000 mL

## 2024-02-03 MED ORDER — SODIUM CHLORIDE 0.9 % WEIGHT BASED INFUSION: 1.0000 mL/kg/h | INTRAVENOUS | Status: DC

## 2024-02-03 MED ORDER — SODIUM CHLORIDE 0.9 % IV SOLN: 250.0000 mL | INTRAVENOUS | Status: DC | PRN

## 2024-02-03 MED ORDER — MIDAZOLAM HCL (PF) 2 MG/2ML IJ SOLN
INTRAMUSCULAR | Status: DC | PRN
  Administered 2024-02-03: 1 mg via INTRAVENOUS

## 2024-02-03 MED ORDER — VERAPAMIL HCL 2.5 MG/ML IV SOLN
INTRAVENOUS | Status: DC | PRN
  Administered 2024-02-03: 10 mL via INTRA_ARTERIAL

## 2024-02-03 MED ORDER — VERAPAMIL HCL 2.5 MG/ML IV SOLN
INTRAVENOUS | Status: AC
  Filled 2024-02-03: qty 2

## 2024-02-03 MED ORDER — MIDAZOLAM HCL 2 MG/2ML IJ SOLN
INTRAMUSCULAR | Status: AC
  Filled 2024-02-03: qty 2

## 2024-02-03 MED ORDER — HEPARIN SODIUM (PORCINE) 1000 UNIT/ML IJ SOLN
INTRAMUSCULAR | Status: DC | PRN
  Administered 2024-02-03: 5000 [IU] via INTRAVENOUS

## 2024-02-03 MED ORDER — LIDOCAINE HCL (PF) 1 % IJ SOLN
INTRAMUSCULAR | Status: DC | PRN
  Administered 2024-02-03 (×2): 2 mL via INTRADERMAL

## 2024-02-03 MED ORDER — SODIUM CHLORIDE 0.9 % WEIGHT BASED INFUSION
3.0000 mL/kg/h | INTRAVENOUS | Status: AC
  Administered 2024-02-03: 3 mL/kg/h via INTRAVENOUS

## 2024-02-03 MED ORDER — ASPIRIN 81 MG PO CHEW
81.0000 mg | CHEWABLE_TABLET | ORAL | Status: AC
  Administered 2024-02-03: 81 mg via ORAL
  Filled 2024-02-03: qty 1

## 2024-02-03 MED ORDER — FENTANYL CITRATE (PF) 100 MCG/2ML IJ SOLN
INTRAMUSCULAR | Status: AC
  Filled 2024-02-03: qty 2

## 2024-02-03 MED ORDER — LIDOCAINE HCL (PF) 1 % IJ SOLN
INTRAMUSCULAR | Status: AC
  Filled 2024-02-03: qty 30

## 2024-02-03 MED ORDER — SODIUM CHLORIDE 0.9% FLUSH: 3.0000 mL | Freq: Two times a day (BID) | INTRAVENOUS | Status: DC

## 2024-02-03 MED ORDER — SODIUM CHLORIDE 0.9% FLUSH: 3.0000 mL | INTRAVENOUS | Status: DC | PRN

## 2024-02-03 MED ORDER — ACETAMINOPHEN 325 MG PO TABS: 650.0000 mg | ORAL_TABLET | ORAL | Status: DC | PRN

## 2024-02-03 MED ORDER — HYDRALAZINE HCL 20 MG/ML IJ SOLN: 10.0000 mg | INTRAMUSCULAR | Status: DC | PRN

## 2024-02-03 MED ORDER — HEPARIN SODIUM (PORCINE) 1000 UNIT/ML IJ SOLN
INTRAMUSCULAR | Status: AC
  Filled 2024-02-03: qty 10

## 2024-02-03 MED ORDER — IOHEXOL 350 MG/ML SOLN
INTRAVENOUS | Status: DC | PRN
  Administered 2024-02-03: 40 mL

## 2024-02-03 SURGICAL SUPPLY — 12 items
CATH 5FR JL3.5 JR4 ANG PIG MP (CATHETERS) IMPLANT
CATH BALLN WEDGE 5F 110CM (CATHETERS) IMPLANT
CATH INFINITI 5FR JL4 (CATHETERS) IMPLANT
DEVICE RAD COMP TR BAND LRG (VASCULAR PRODUCTS) IMPLANT
GLIDESHEATH SLEND SS 6F .021 (SHEATH) IMPLANT
GLIDESHEATH SLENDER 7FR .021G (SHEATH) IMPLANT
GUIDEWIRE .025 260CM (WIRE) IMPLANT
GUIDEWIRE INQWIRE 1.5J.035X260 (WIRE) IMPLANT
PACK CARDIAC CATHETERIZATION (CUSTOM PROCEDURE TRAY) ×2 IMPLANT
SET ATX-X65L (MISCELLANEOUS) IMPLANT
SHEATH GLIDE SLENDER 4/5FR (SHEATH) IMPLANT
SHEATH PROBE COVER 6X72 (BAG) IMPLANT

## 2024-02-03 NOTE — Interval H&P Note (Signed)
 History and Physical Interval Note:  02/03/2024 11:52 AM  Larry Liu  has presented today for surgery, with the diagnosis of aortic stenosis.  The various methods of treatment have been discussed with the patient and family. After consideration of risks, benefits and other options for treatment, the patient has consented to  Procedure(s): RIGHT/LEFT HEART CATH AND CORONARY ANGIOGRAPHY (N/A) as a surgical intervention.  The patient's history has been reviewed, patient examined, no change in status, stable for surgery.  I have reviewed the patient's chart and labs.  Questions were answered to the patient's satisfaction.     Maude [facility] 02/03/2024 11:52 AM

## 2024-02-03 NOTE — Discharge Instructions (Signed)
 Radial Site Care  This sheet gives you information about how to care for yourself after your procedure. Your health care provider may also give you more specific instructions. If you have problems or questions, contact your health care provider. What can I expect after the procedure? After the procedure, it is common to have: Bruising and tenderness at the catheter insertion area. Follow these instructions at home: Medicines Take over-the-counter and prescription medicines only as told by your health care provider. Insertion site care Follow instructions from your health care provider about how to take care of your insertion site. Make sure you: Wash your hands with soap and water before you remove your bandage (dressing). If soap and water are not available, use hand sanitizer. May remove dressing in 24 hours. Check your insertion site every day for signs of infection. Check for: Redness, swelling, or pain. Fluid or blood. Pus or a bad smell. Warmth. Do no take baths, swim, or use a hot tub for 5 days. You may shower 24-48 hours after the procedure. Remove the dressing and gently wash the site with plain soap and water. Pat the area dry with a clean towel. Do not rub the site. That could cause bleeding. Do not apply powder or lotion to the site. Activity  For 24 hours after the procedure, or as directed by your health care provider: Do not flex or bend the affected arm. Do not push or pull heavy objects with the affected arm. Do not drive yourself home from the hospital or clinic. You may drive 24 hours after the procedure. Do not operate machinery or power tools. KEEP ARM ELEVATED THE REMAINDER OF THE DAY. Do not push, pull or lift anything that is heavier than 10 lb for 5 days. Ask your health care provider when it is okay to: Return to work or school. Resume usual physical activities or sports. Resume sexual activity. General instructions If the catheter site starts to  bleed, raise your arm and put firm pressure on the site. If the bleeding does not stop, get help right away. This is a medical emergency. DRINK PLENTY OF FLUIDS FOR THE NEXT 2-3 DAYS. No alcohol consumption for 24 hours after receiving sedation. If you went home on the same day as your procedure, a responsible adult should be with you for the first 24 hours after you arrive home. Keep all follow-up visits as told by your health care provider. This is important. Contact a health care provider if: You have a fever. You have redness, swelling, or yellow drainage around your insertion site. Get help right away if: You have unusual pain at the radial site. The catheter insertion area swells very fast. The insertion area is bleeding, and the bleeding does not stop when you hold steady pressure on the area. Your arm or hand becomes pale, cool, tingly, or numb. These symptoms may represent a serious problem that is an emergency. Do not wait to see if the symptoms will go away. Get medical help right away. Call your local emergency services (911 in the [place] Do not drive yourself to the hospital. Summary After the procedure, it is common to have bruising and tenderness at the site. Follow instructions from your health care provider about how to take care of your radial site wound. Check the wound every day for signs of infection.  This information is not intended to replace advice given to you by your health care provider. Make sure you discuss any questions you have with  your health care provider. Document Revised: 04/23/2017 Document Reviewed: 04/23/2017 Elsevier Patient Education  2020 [employer].

## 2024-02-04 ENCOUNTER — Other Ambulatory Visit: Payer: Self-pay

## 2024-02-04 ENCOUNTER — Encounter (HOSPITAL_COMMUNITY): Payer: Self-pay | Admitting: Cardiology

## 2024-02-04 DIAGNOSIS — I350 Nonrheumatic aortic (valve) stenosis: Secondary | ICD-10-CM

## 2024-02-09 ENCOUNTER — Other Ambulatory Visit: Payer: Self-pay

## 2024-02-09 DIAGNOSIS — I350 Nonrheumatic aortic (valve) stenosis: Secondary | ICD-10-CM

## 2024-02-12 ENCOUNTER — Ambulatory Visit (HOSPITAL_COMMUNITY)
Admission: RE | Admit: 2024-02-12 | Discharge: 2024-02-12 | Disposition: A | Discharge status: Home / Self Care | Attending: Cardiology | Admitting: Cardiology

## 2024-02-12 ENCOUNTER — Telehealth: Payer: Self-pay | Admitting: Internal Medicine

## 2024-02-12 ENCOUNTER — Ambulatory Visit: Payer: Self-pay | Admitting: Internal Medicine

## 2024-02-12 DIAGNOSIS — K869 Disease of pancreas, unspecified: Secondary | ICD-10-CM | POA: Insufficient documentation

## 2024-02-12 DIAGNOSIS — E042 Nontoxic multinodular goiter: Secondary | ICD-10-CM | POA: Diagnosis not present

## 2024-02-12 DIAGNOSIS — I358 Other nonrheumatic aortic valve disorders: Secondary | ICD-10-CM | POA: Insufficient documentation

## 2024-02-12 DIAGNOSIS — I2510 Atherosclerotic heart disease of native coronary artery without angina pectoris: Secondary | ICD-10-CM | POA: Insufficient documentation

## 2024-02-12 DIAGNOSIS — I350 Nonrheumatic aortic (valve) stenosis: Secondary | ICD-10-CM | POA: Insufficient documentation

## 2024-02-12 DIAGNOSIS — Z01810 Encounter for preprocedural cardiovascular examination: Secondary | ICD-10-CM | POA: Insufficient documentation

## 2024-02-12 DIAGNOSIS — R918 Other nonspecific abnormal finding of lung field: Secondary | ICD-10-CM | POA: Insufficient documentation

## 2024-02-12 LAB — BASIC METABOLIC PANEL WITH GFR
BUN/Creatinine Ratio: 10 (ref 10–24)
BUN: 18 mg/dL (ref 8–27)
CO2: 22 mmol/L (ref 20–29)
Calcium: 9.4 mg/dL (ref 8.6–10.2)
Chloride: 102 mmol/L (ref 96–106)
Creatinine, Ser: 1.78 mg/dL — ABNORMAL HIGH (ref 0.76–1.27)
Glucose: 121 mg/dL — ABNORMAL HIGH (ref 70–99)
Potassium: 4.1 mmol/L (ref 3.5–5.2)
Sodium: 139 mmol/L (ref 134–144)
eGFR: 38 mL/min/1.73 — ABNORMAL LOW (ref >59)

## 2024-02-12 MED ORDER — IOHEXOL 350 MG/ML SOLN
100.0000 mL | Freq: Once | INTRAVENOUS | Status: AC | PRN
  Administered 2024-02-12: 100 mL via INTRAVENOUS

## 2024-02-12 NOTE — Telephone Encounter (Signed)
 Received incoming STAT call from [place] at [facility] concerning the CT ANGIO ABDOMEN PELVIS W & WO CONTRAST form today (04/14/23). The concern is listed below in #2. Will route to Dr. Wendel as he is in clinic today.   Copy and Pasted from report:  IMPRESSION: 1.  Minimal vessel diameters detailed above.   2. Two lesions are present in the pancreas: A fat containing lesion in the pancreatic head which is similar and may be a lipoma. A small enhancing lesion in the tail which is incompletely characterized. Consider further evaluation by MRI of the abdomen with and without contrast for further characterization.

## 2024-02-12 NOTE — Telephone Encounter (Signed)
Calling with a call report. Please advise  

## 2024-02-12 NOTE — Progress Notes (Signed)
 Procedure Type: Isolated AVR Perioperative Outcome Estimate % Operative Mortality 4.67% Morbidity & Mortality 17.3% Stroke 2.28% Renal Failure 7.65% Reoperation 4.73% Prolonged Ventilation 9.68% Deep Sternal Wound Infection 0.087% Long Hospital Stay (>14 days) 9.87% Short Hospital Stay (<6 days)* 21%

## 2024-02-13 ENCOUNTER — Ambulatory Visit: Payer: Self-pay | Admitting: Internal Medicine

## 2024-02-13 NOTE — Telephone Encounter (Signed)
 Two lesions are present in the pancreas: A fat containing lesion in the pancreatic head which is similar and may be a lipoma. A small enhancing lesion in the tail which is incompletely characterized. Consider further evaluation by MRI of the abdomen with and without contrast for further characterization.  Thyroid nodules. Consideration should be given toward further evaluation by dedicated thyroid ultrasound for definitive characterization.  Small pulmonary nodules are present. If patient is low risk for malignancy, no routine follow-up imaging is recommended. If patient is high risk for malignancy, a non-contrast chest CT at 12 months is optional.  Per Dr Wendel the pancreatic findings will require further evaluation prior to proceeding with cardiac surgery.  An order has been placed for MRI Abdomen with and without contrast.    Maren-can you arrange MRI and also make him aware of upcoming appointment with Dr Daniel.  Depending on when MRI can be performed (request first available) we may have to reschedule surgeon evaluation.

## 2024-02-19 ENCOUNTER — Ambulatory Visit (HOSPITAL_COMMUNITY)

## 2024-02-20 ENCOUNTER — Encounter: Admitting: Thoracic Surgery (Cardiothoracic Vascular Surgery)

## 2024-02-20 DIAGNOSIS — R55 Syncope and collapse: Secondary | ICD-10-CM | POA: Diagnosis not present

## 2024-02-20 NOTE — Addendum Note (Signed)
 Encounter addended by: Tahisha Hakim N on: 02/20/2024 4:51 PM  Actions taken: Imaging Exam ended

## 2024-02-23 ENCOUNTER — Ambulatory Visit

## 2024-02-23 VITALS — BP 116/65 | HR 70 | Resp 18 | Ht 72.0 in | Wt 247.0 lb

## 2024-02-23 DIAGNOSIS — I350 Nonrheumatic aortic (valve) stenosis: Secondary | ICD-10-CM

## 2024-02-23 NOTE — Progress Notes (Unsigned)
 Pre Surgical Assessment: 5 M Walk Test  45M=16.18ft  5 Meter Walk Test- trial 1:  5 Meter Walk Test- trial 2:  5 Meter Walk Test- trial 3: 5 Meter Walk Test Average:   Patient refused walk test. Unable to perform today.

## 2024-02-23 NOTE — Progress Notes (Unsigned)
 HEART AND VASCULAR CENTER   MULTIDISCIPLINARY HEART VALVE CLINIC    KOLSON CHOVANEC [facility] Medical Record #[id number] Date of Birth: 16-Apr-1940  Referring: Wendel Lurena POUR, MD Primary Care: [facility] Primary Cardiologist:None  Chief Complaint:    Chief Complaint  Patient presents with   Aortic Stenosis    TAVR consult/ review all testing    History of Present Illness:     Larry Liu is a 83 y.o. male presents for evaluation of severe aortic stenosis and TAVR. Recently had syncopal episode, fatigue and dyspnea.  No syncope since going home.  Lives with his wife in a house.  He drives, feeds and bathes himself.  Walks with a cane - has had frequent falls.  Former smoker, quit 20 years ago.  Has partial dentures, sees dentist regularly at the [facility].   My measurements: Annulus: Area 485, dia 24.9, perimeter 78.9 Very bulky calcification of NCC Sinuses: 33-34 mm RCA 18.1 mm, LCA 15.8 mm Access: Femorals are capacious.  Less anterior calcium  on the left at the access site.  Past Medical History:  Diagnosis Date   Diabetes mellitus without complication (HCC)    High cholesterol    Hypertension    Obesity     Past Surgical History:  Procedure Laterality Date   REPLACEMENT TOTAL KNEE BILATERAL     RIGHT/LEFT HEART CATH AND CORONARY ANGIOGRAPHY N/A 02/03/2024   Procedure: RIGHT/LEFT HEART CATH AND CORONARY ANGIOGRAPHY;  Surgeon: Jordan, Peter M, MD;  Location: [facility] INVASIVE CV LAB;  Service: Cardiovascular;  Laterality: N/A;    Social History:  Social History   Tobacco Use  Smoking Status Former  Smokeless Tobacco Never    Social History   Substance and Sexual Activity  Alcohol Use Yes   Comment: occ     Allergies  Allergen Reactions   Lisinopril Cough, Other (See Comments), Rash and Dermatitis    Other Reaction(s): Eruption, OTHER REACTION   Hydrocodone  Nausea And Vomiting      Current Outpatient Medications  Medication Sig Dispense Refill    amoxicillin (AMOXIL) 500 MG capsule Take 2,000 mg by mouth See admin instructions. Take 2,000 mg by mouth one hour prior to dental procedures     aspirin  EC 81 MG tablet Take 81 mg by mouth daily.     atenolol (TENORMIN) 25 MG tablet Take 1 tablet (25 mg total) by mouth daily.     DULoxetine  (CYMBALTA ) 30 MG capsule Take 30 mg by mouth daily.     fluticasone (FLONASE) 50 MCG/ACT nasal spray Place 2 sprays into the nose daily.     glipiZIDE (GLUCOTROL) 10 MG tablet Take 10 mg by mouth daily before breakfast.     latanoprost  (XALATAN ) 0.005 % ophthalmic solution Place 1 drop into both eyes daily.     losartan (COZAAR) 100 MG tablet Take 100 mg by mouth daily.     naloxone (NARCAN) nasal spray 4 mg/0.1 mL Place into the nose.     rosuvastatin  (CRESTOR ) 40 MG tablet Take 20 mg by mouth daily.     tamsulosin (FLOMAX) 0.4 MG CAPS capsule Take 0.4 mg by mouth at bedtime.     zolpidem  (AMBIEN ) 10 MG tablet Take 10 mg by mouth at bedtime.     No current facility-administered medications for this visit.    (Not in a hospital admission)   Family History  Problem Relation Age of Onset   Stroke Mother    Stroke Father      Review  of Systems:   Review of Systems  Constitutional:  Positive for malaise/fatigue. Negative for weight loss.  Respiratory:  Positive for shortness of breath.   Cardiovascular:  Negative for chest pain, palpitations and leg swelling.  Gastrointestinal:  Negative for heartburn and nausea.  Neurological:  Positive for dizziness, loss of consciousness and weakness. Negative for headaches.      Physical Exam: BP 116/65   Pulse 70   Resp 18   Ht 6' (1.829 m)   Wt 247 lb (112 kg)   SpO2 99%   BMI 33.50 kg/m  Physical Exam Constitutional:      Appearance: He is obese.  HENT:     Head: Normocephalic and atraumatic.  Cardiovascular:     Rate and Rhythm: Normal rate and regular rhythm.     Heart sounds: Murmur heard.  Pulmonary:     Effort: Pulmonary effort is  normal.     Breath sounds: Normal breath sounds.  Abdominal:     General: There is no distension.     Palpations: Abdomen is soft.     Tenderness: There is no abdominal tenderness.  Musculoskeletal:        General: No swelling.  Skin:    General: Skin is warm and dry.  Neurological:     General: No focal deficit present.     Mental Status: He is alert and oriented to person, place, and time.       Cardiac Studies & Procedures   ______________________________________________________________________________________________ CARDIAC CATHETERIZATION  CARDIAC CATHETERIZATION 02/03/2024  Conclusion   LV end diastolic pressure is mildly elevated.   Hemodynamic findings consistent with mild pulmonary hypertension.   There is moderate aortic valve stenosis.  No significant CAD Moderate to severe aortic stenosis. AV mean gradient 36 mm Hg. AVA 1.19 cm squared with index 0.51 PCWP 17/15, mean 13 mm Hg. LVEDP 17 mm Hg PAP 41/16 with mean 25 mm Hg Normal cardiac output 7.2 L/min, index 3.1  Plan Consider for TAVR  Findings Coronary Findings Diagnostic  Dominance: Left  Left Main Vessel was injected. Vessel is normal in caliber. Vessel is angiographically normal.  Left Anterior Descending Vessel was injected. Vessel is normal in caliber. The vessel exhibits minimal luminal irregularities.  Left Circumflex Vessel was injected. Vessel is large. The vessel exhibits minimal luminal irregularities.  Right Coronary Artery Vessel was injected. Vessel is small. The vessel exhibits minimal luminal irregularities.  Intervention  No interventions have been documented.     ECHOCARDIOGRAM  ECHOCARDIOGRAM COMPLETE 01/16/2024  Narrative ECHOCARDIOGRAM REPORT    Patient Name:   Larry Liu Date of Exam: 01/16/2024 Medical Rec #:  [id number]      Height:       72.0 in Accession #:    [id number]     Weight:       240.0 lb Date of Birth:  01-Jul-1940      BSA:          2.302  m Patient Age:    83 years       BP:           156/99 mmHg Patient Gender: M              HR:           67 bpm. Exam Location:  Inpatient  Procedure: 2D Echo, Cardiac Doppler and Color Doppler (Both Spectral and Color Flow Doppler were utilized during procedure).  Indications:    Syncope  History:  Patient has no prior history of Echocardiogram examinations. Risk Factors:Hypertension and Diabetes.  Sonographer:    Philomena Daring Referring Phys: [id number] VISHAL R PATEL  IMPRESSIONS   1. Left ventricular ejection fraction, by estimation, is 55 to 60%. The left ventricle has normal function. The left ventricle has no regional wall motion abnormalities. Left ventricular diastolic parameters are indeterminate. 2. Right ventricular systolic function is normal. The right ventricular size is normal. 3. Left atrial size was mildly dilated. 4. The mitral valve is normal in structure. No evidence of mitral valve regurgitation. Mild mitral stenosis. The mean mitral valve gradient is 3.3 mmHg with average heart rate of 78 bpm. Severe mitral annular calcification. 5. The aortic valve is abnormal. There is severe calcifcation of the aortic valve. Aortic valve regurgitation is trivial. 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 6. The inferior vena cava is normal in size with greater than 50% respiratory variability, suggesting right atrial pressure of 3 mmHg.  Comparison(s): No prior Echocardiogram.  Conclusion(s)/Recommendation(s): Valvular findings as outlined below. Due to the above findings, cardiology consult is recommended.  FINDINGS Left Ventricle: Left ventricular ejection fraction, by estimation, is 55 to 60%. The left ventricle has normal function. The left ventricle has no regional wall motion abnormalities. The left ventricular internal cavity size was normal in size. There is no left ventricular hypertrophy. Left ventricular diastolic parameters are  indeterminate.  Right Ventricle: The right ventricular size is normal. No increase in right ventricular wall thickness. Right ventricular systolic function is normal.  Left Atrium: Left atrial size was mildly dilated.  Right Atrium: Right atrial size was normal in size.  Pericardium: There is no evidence of pericardial effusion. Presence of epicardial fat layer.  Mitral Valve: The mitral valve is normal in structure. Severe mitral annular calcification. No evidence of mitral valve regurgitation. Mild mitral valve stenosis. The mean mitral valve gradient is 3.3 mmHg with average heart rate of 78 bpm.  Tricuspid Valve: The tricuspid valve is normal in structure. Tricuspid valve regurgitation is not demonstrated. No evidence of tricuspid stenosis.  Aortic Valve: The aortic valve is abnormal. There is severe calcifcation of the aortic valve. Aortic valve regurgitation is trivial. Aortic valve mean gradient measures 20.0 mmHg. Aortic valve peak gradient measures 37.9 mmHg. SVI 31, DI 0.52. Aortic valve area, by VTI measures 1.24 cm. 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   Pulmonic Valve: The pulmonic valve was normal in structure. Pulmonic valve regurgitation is not visualized. No evidence of pulmonic stenosis.  Aorta: The aortic root and ascending aorta are structurally normal, with no evidence of dilitation.  Venous: The inferior vena cava is normal in size with greater than 50% respiratory variability, suggesting right atrial pressure of 3 mmHg.  IAS/Shunts: No atrial level shunt detected by color flow Doppler.   LEFT VENTRICLE PLAX 2D LVIDd:         4.25 cm   Diastology LVIDs:         2.38 cm   LV e' medial:    7.94 cm/s LV PW:         1.18 cm   LV E/e' medial:  15.5 LV IVS:        1.23 cm   LV e' lateral:   9.36 cm/s LVOT diam:     1.75 cm   LV E/e' lateral: 13.1 LV SV:         71 LV SV Index:   31 LVOT Area:  2.41 cm   RIGHT VENTRICLE              IVC RV Basal diam:  3.23 cm     IVC diam: 1.67 cm RV Mid diam:    2.66 cm RV S prime:     16.80 cm/s TAPSE (M-mode): 3.0 cm  LEFT ATRIUM             Index        RIGHT ATRIUM           Index LA diam:        3.76 cm 1.63 cm/m   RA Area:     12.80 cm LA Vol (A2C):   62.5 ml 27.15 ml/m  RA Volume:   25.80 ml  11.21 ml/m LA Vol (A4C):   46.8 ml 20.33 ml/m LA Biplane Vol: 55.5 ml 24.11 ml/m AORTIC VALVE AV Area (Vmax):    1.12 cm AV Area (Vmean):   1.26 cm AV Area (VTI):     1.24 cm AV Vmax:           308.00 cm/s AV Vmean:          186.000 cm/s AV VTI:            0.570 m AV Peak Grad:      37.9 mmHg AV Mean Grad:      20.0 mmHg LVOT Vmax:         143.00 cm/s LVOT Vmean:        97.150 cm/s LVOT VTI:          0.294 m LVOT/AV VTI ratio: 0.52  AORTA Ao Root diam: 2.61 cm Ao Asc diam:  3.40 cm  MITRAL VALVE MV Area (PHT): 3.37 cm     SHUNTS MV Mean grad:  3.3 mmHg     Systemic VTI:  0.29 m MV Decel Time: 225 msec     Systemic Diam: 1.75 cm MV E velocity: 123.00 cm/s MV A velocity: 129.00 cm/s MV E/A ratio:  0.95  Emeline Calender Electronically signed by Emeline Calender Signature Date/Time: 01/16/2024/11:35:48 AM    Final    MONITORS  LONG TERM MONITOR-LIVE TELEMETRY (3-14 DAYS) 02/20/2024  Narrative Patch Wear Time:  5 days and 18 hours (2025-10-17T13:24:40-0400 to 2025-10-23T07:43:55-0400)  Patient had a min HR of 59 bpm, max HR of 179 bpm, and avg HR of 78 bpm. Predominant underlying rhythm was Sinus Rhythm.  Events: -6 Supraventricular Tachycardia runs occurred, the run with the fastest interval lasting 5 beats with a max rate of 179 bpm, the longest lasting 10 beats with an avg rate of 118 bpm.  -Isolated SVEs were occasional (1.9%, 10905), SVE Couplets were rare (<1.0%, 521), and SVE Triplets were rare (<1.0%, 24).  -Isolated VEs were occasional (1.5%, 8549), VE Couplets were rare (<1.0%, 33), and no VE Triplets were present. Ventricular Trigeminy  was present.  -No atrial fibrillation, sustained ventricular tachyarrhythmias, or bradyarrhythmias were detected.  -Patient triggered events corresponded with sinus rhythm, PACs, and PVCs  Summary: Occasional PACs and PVCs which were associated with symptoms.  Consider medical therapy.   CT SCANS  CT CORONARY MORPH W/CTA COR W/SCORE 02/12/2024  Addendum 02/15/2024  9:29 PM ADDENDUM REPORT: 02/15/2024 21:26  EXAM: OVER-READ INTERPRETATION  CT CHEST  The following report is an over-read performed by radiologist Dr. Kate [facility] on 02/15/2024. This over-read does not include interpretation of cardiac or coronary anatomy or pathology. The CT heart morphology in CTA calcium  score interpretation by  the cardiologist is attached.  COMPARISON:  CT angiography chest 02/12/2024, CT renal 06/30/2018  FINDINGS: Subpleural right upper lobe micronodule (307:3).  Tiny hiatal hernia.  No lymphadenopathy.  Fluid density lesion of the right kidney likely represents a simple renal cyst. Simple renal cysts, in the absence of clinically indicated signs/symptoms, require no independent follow-up. Nonspecific punctate density within the hepatic parenchyma.  No acute osseous abnormality.  Degenerative changes of the spine.  No acute soft tissue abnormality.  IMPRESSION: Subpleural right upper lobe micronodule No follow-up needed if patient is low-risk.This recommendation follows the consensus statement: Guidelines for Management of Incidental Pulmonary Nodules Detected on CT Images: From the Fleischner Society 2017; Radiology 2017; 284:228-243.   Electronically Signed By: Morgane  Naveau M.D. On: 02/15/2024 21:26  Narrative CLINICAL DATA:  Aortic valve replacement (TAVR), pre-op eval  EXAM: Cardiac TAVR CT  TECHNIQUE: A non-contrast, gated CT scan was obtained with axial slices of 2.5 mm through the heart for aortic valve scoring. A 120  kV retrospective, gated, contrast cardiac scan was obtained. Gantry rotation speed was 230 msec and collimation was 0.63 mm. Nitroglycerin was not given. A delayed scan was obtained to exclude left atrial appendage thrombus. The 3D dataset was reconstructed in systole with motion correction. The 3D data set was reconstructed in 5% intervals of the 0-95% of the R-R cycle. Systolic and diastolic phases were analyzed on a dedicated workstation using MPR, MIP, and VRT modes. The patient received 100 cc of contrast.  FINDINGS: Aortic Valve:  Tricuspid aortic valve with severely reduced cusp excursion. Severely thickened and severely calcified aortic valve cusps.  AV calcium  score: 2948  Virtual Basal Annulus Measurements:  Maximum/Minimum Diameter: 27.5 x 21.2 mm  Perimeter: 76.5 mm  Area:  443 mm2  No significant LVOT calcifications.  Membranous septal length: 6.5 mm  Based on these measurements, the annulus would be suitable for a 26 mm Sapien 3 valve. Alternatively, Heart Team can consider 29 mm Evolut valve. Recommend Heart Team discussion for valve selection.  Sinus of Valsalva Measurements:  Non-coronary:  35 mm  Right - coronary:  35 mm  Left - coronary:  37 mm  Coronary height and sinus of Valsalva Height:  Left main: 18.9 mm, Left sinus: 27 mm  Right coronary: 21.7 mm, Right sinus: 24.4 mm  Aorta: Conventional 3 vessel branch pattern of aortic arch.  Sinotubular Junction:  31 mm  Ascending Thoracic Aorta:  38 mm  Aortic Arch:  36 mm  Descending Thoracic Aorta:  33 mm  Coronary Arteries: Normal coronary origin. Left dominance. The study was performed without use of NTG and insufficient for plaque evaluation.  Optimum Fluoroscopic Angle for Delivery: RAO 11 CRA 16  OTHER:  Atria: Mild LA dilation  Left atrial appendage: No thrombus.  Mitral valve: Grossly normal, mild mitral annular calcifications.  Pulmonary artery: Normal  caliber.  Pulmonary veins: Normal anatomy.  IMPRESSION: 1. Tricuspid aortic valve with severely reduced cusp excursion. Severely thickened and severely calcified aortic valve cusps. 2. Aortic valve calcium  score: 2948 3. Annulus area: 443 mm2, suitable for 26 mm Sapien 3 valve. No LVOT calcifications. Membranous septal length 6.5 mm. 4. Sufficient coronary artery heights from annulus. 5. Optimum fluoroscopic angle for delivery:  RAO 11 CRA 16  Electronically Signed: By: Soyla Merck M.D. On: 02/12/2024 17:58     ______________________________________________________________________________________________      ECG NSR    I have independently reviewed the above radiologic studies and discussed with the patient  Recent Lab Findings: Lab Results  Component Value Date   WBC 8.8 01/16/2024   HGB 12.2 (L) 02/03/2024   HCT 36.0 (L) 02/03/2024   PLT 163 01/16/2024   GLUCOSE 121 (H) 02/11/2024   ALT 12 01/15/2024   AST 24 01/15/2024   NA 139 02/11/2024   K 4.1 02/11/2024   CL 102 02/11/2024   CREATININE 1.78 (H) 02/11/2024   BUN 18 02/11/2024   CO2 22 02/11/2024   INR 1.3 (H) 06/30/2018      Assessment / Plan:   83 y.o. male with severe aortic stenosis.  STS score: 4.67%.  NYHA Class II.  The risks and benefits of transfemoral TAVR were discussed in detail.  The risks included death, stroke, paravalvular leak, aortic dissection, annulus rupture, device embolization, acute myocardial infarction, arrhythmia, heart block or need for permanent pacemaker.  We also discussed possibility of an emergent sternotomy to address any procedural complications.  Based on our discussion, we collectively decided that an emergent sternotomy would not be indicated.  The patient is agreeable to proceed.  Based on my review of his LHC, echo, and CTA, I agree with the multidisciplinary plan to proceed with a 26 mm S3 transfemoral TAVR.  I  spent 45 minutes counseling the patient face to  face.   Con RAMAN Gabreal Worton 02/23/2024 3:52 PM

## 2024-02-24 ENCOUNTER — Ambulatory Visit (HOSPITAL_COMMUNITY)
Admission: RE | Admit: 2024-02-24 | Discharge: 2024-02-24 | Disposition: A | Discharge status: Home / Self Care | Source: Ambulatory Visit | Attending: Internal Medicine | Admitting: Internal Medicine

## 2024-02-24 ENCOUNTER — Encounter (HOSPITAL_COMMUNITY): Payer: Self-pay

## 2024-02-24 ENCOUNTER — Ambulatory Visit: Payer: Self-pay | Admitting: Cardiology

## 2024-02-24 DIAGNOSIS — K869 Disease of pancreas, unspecified: Secondary | ICD-10-CM

## 2024-02-24 NOTE — Progress Notes (Signed)
 Patient is unable to have any MRI's at this time per radiologist Dr. Raynold. Patient have 83yo penile implant with no information in chart or a card with make/model. Implant is seen on CT scan from 02/12/24 and it shows it has metal components. Radiologist suggest repeat CT scan for pancreatic lesion.

## 2024-03-02 ENCOUNTER — Other Ambulatory Visit: Payer: Self-pay

## 2024-03-02 DIAGNOSIS — K869 Disease of pancreas, unspecified: Secondary | ICD-10-CM

## 2024-03-02 DIAGNOSIS — I350 Nonrheumatic aortic (valve) stenosis: Secondary | ICD-10-CM

## 2024-03-05 ENCOUNTER — Ambulatory Visit (HOSPITAL_COMMUNITY)

## 2024-03-08 ENCOUNTER — Ambulatory Visit (HOSPITAL_COMMUNITY): Admission: RE | Admit: 2024-03-08 | Discharge: 2024-03-08 | Attending: Physician Assistant

## 2024-03-08 DIAGNOSIS — K869 Disease of pancreas, unspecified: Secondary | ICD-10-CM

## 2024-03-08 DIAGNOSIS — I350 Nonrheumatic aortic (valve) stenosis: Secondary | ICD-10-CM

## 2024-03-08 MED ORDER — IOHEXOL 350 MG/ML SOLN
80.0000 mL | Freq: Once | INTRAVENOUS | Status: AC | PRN
  Administered 2024-03-08: 80 mL via INTRAVENOUS

## 2024-03-15 ENCOUNTER — Ambulatory Visit: Payer: Self-pay | Admitting: Physician Assistant

## 2024-03-30 ENCOUNTER — Other Ambulatory Visit: Payer: Self-pay

## 2024-03-30 DIAGNOSIS — I350 Nonrheumatic aortic (valve) stenosis: Secondary | ICD-10-CM

## 2024-04-03 NOTE — H&P (View-Only) (Signed)
 " HEART AND VASCULAR CENTER   MULTIDISCIPLINARY HEART VALVE CLINIC                                     Cardiology Office Note:    Date:  04/05/2024   ID:  Larry Liu, DOB 1940/05/31, MRN [id number]  PCP:  [facility]  [facility] Cardiologist:  Lurena MARLA Red, MD  [facility] Structural heart: [name], MD [facility] Electrophysiologist:  None   Referring MD: [facility]   Pre TAVR visit.   History of Present Illness:    Larry Liu is a 84 y.o. male with a hx of T2DM, HLD, HTN, CKD IIIb, morbid obesity (BMI 44) and severe pLFLG AS who presents to clinic for follow up.  Admitted in 12/2023 for a syncopal episode. Also complained of fatigue and dyspnea. Echo 02/03/24 showed EF 55%, mild MS, and severe LFLG AS: mean grad 20 mmHg, Vmax 3.08 m/s, AVA 1.24 cm2, DVI 0.52, SVI 31. L/RHC 02/03/24 showed no significant CAD and moderate to severe aortic stenosis. AV mean gradient 36 mm Hg. AVA 1.19 cm squared with index 0.51. PCWP 17/15, mean 13 mm Hg. LVEDP 17 mm Hg. PAP 41/16 with mean 25 mm Hg. Normal cardiac output 7.2 L/min, index 3.1  CTs 02/12/24 showed anatomy amendable 26 mm Edwards S3 via the TF approach. He has been evaluated by the multidisciplinary valve team and felt to have severe, symptomatic aortic stenosis and to be a suitable candidate for TAVR, which was set up for 04/14/23.    Today the patient presents to clinic for follow up. He is here with his wife. He lives with his wife in a house. He drives, feeds and bathes himself. Walks with a cane - has had frequent falls, but none recently. He meets with a group of friends everyday at a different breakfast place. He is mostly limited by fatigue. Denies CP, SOB, LE edema, orthopnea or PND. He would like to have more energy. Watches TV all day. Would love to be able to get back on golf course.    Past Medical History:  Diagnosis Date   Diabetes mellitus without complication (HCC)    High  cholesterol    Hypertension    Obesity      Current Medications: Active Medications[1]    ROS:   Please see the history of present illness.    All other systems reviewed and are negative.  EKGs   EKG Interpretation Date/Time:  Monday April 05 2024 10:22:18 EST Ventricular Rate:  76 PR Interval:  174 QRS Duration:  78 QT Interval:  394 QTC Calculation: 443 R Axis:   12  Text Interpretation: Normal sinus rhythm When compared with ECG of 15-Jan-2024 14:18, Sinus rhythm has replaced Junctional rhythm Confirmed by Sebastian Pagan [phone]) on 04/05/2024 10:43:01 AM   Risk Assessment/Calculations:           Physical Exam:    VS:  BP 138/80   Pulse 76   Ht 6' (1.829 m)   Wt 261 lb 4.8 oz (118.5 kg)   SpO2 96%   BMI 35.44 kg/m     Wt Readings from Last 3 Encounters:  04/05/24 261 lb 4.8 oz (118.5 kg)  02/23/24 247 lb (112 kg)  02/03/24 247 lb (112 kg)     GEN: Well nourished, well developed in no acute distress, obese. NECK: No JVD  CARDIAC: RRR, 3/6 systolic murmur heard best at RUSB. No rubs, gallops RESPIRATORY:  Clear to auscultation without rales, wheezing or rhonchi  ABDOMEN: Soft, non-tender, non-distended EXTREMITIES:  No edema; No deformity.    ASSESSMENT:    1. Aortic stenosis, severe   2. Primary hypertension   3. Stage 3b chronic kidney disease (HCC)   4. BMI 40.0-44.9, adult (HCC)   5. Lesion of pancreas   6. Thyroid nodule   7. Pulmonary nodules     PLAN:    In order of problems listed above:  Severe AS:  -- Set up for TAVR-TF, on 04/14/23 with Dr. Wonda and Dr. Daniel. Noted to have less anterior calcium  on the left at the access site.  -- NYHA class II with mostly fatigue. -- Given instruction letter for PAT and TAVR.  HTN: -- BP 138/80.  -- Continue losartan 100mg  daily.   CKD stage IIIb: -- Creat baseline 1.78-1.98 -- Labs to be checked 04/09/24 at PAT.   Morbid obesity: -- Body mass index is 35.44 kg/m. -- Consider GLP 1  therapy in the future.   Lesion of pancreas: -- Noted on pre TAVR CT. 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  -- No further work up needed.   Thyroid nodules: -- Noted on TAVR CTs.  -- Consideration should be given toward further evaluation by dedicated thyroid ultrasound for definitive characterization.  -- This will be discussed after TAVR.   Pulmonary nodules: -- Noted on TAVR CTs. -- If patient is low risk for malignancy, no routine follow-up imaging is recommended. If patient is high risk for malignancy, a non-contrast chest CT at 12 months is optional.   -- This will be discussed after TAVR.                                     Medication Adjustments/Labs and Tests Ordered: Current medicines are reviewed at length with the patient today.  Concerns regarding medicines are outlined above.  Orders Placed This Encounter  Procedures   EKG 12-Lead   No orders of the defined types were placed in this encounter.   There are no Patient Instructions on file for this visit.   Signed, Lamarr Hummer, PA-C  04/05/2024 10:59 AM    [facility]     [1]  Current Meds  Medication Sig   amoxicillin (AMOXIL) 500 MG capsule Take 2,000 mg by mouth See admin instructions. Take 2,000 mg by mouth one hour prior to dental procedures   aspirin  EC 81 MG tablet Take 81 mg by mouth daily. (Patient taking differently: Take 81 mg by mouth daily. Only takes occasionally)   atenolol  (TENORMIN ) 25 MG tablet Take 1 tablet (25 mg total) by mouth daily.   DULoxetine  (CYMBALTA ) 30 MG capsule Take 30 mg by mouth daily.   fluticasone (FLONASE) 50 MCG/ACT nasal spray Place 2 sprays into the nose daily.   glipiZIDE (GLUCOTROL) 10 MG tablet Take 10 mg by mouth daily before breakfast.   latanoprost  (XALATAN ) 0.005 %  ophthalmic solution Place 1 drop into both eyes daily.   losartan (COZAAR) 100 MG tablet Take 100 mg by mouth daily.   naloxone (NARCAN) nasal spray 4 mg/0.1 mL Place into the nose.   rosuvastatin  (CRESTOR ) 40 MG  tablet Take 20 mg by mouth daily.   tamsulosin  (FLOMAX ) 0.4 MG CAPS capsule Take 0.4 mg by mouth at bedtime.   zolpidem  (AMBIEN ) 10 MG tablet Take 10 mg by mouth at bedtime.   "

## 2024-04-03 NOTE — Progress Notes (Unsigned)
 " HEART AND VASCULAR CENTER   MULTIDISCIPLINARY HEART VALVE CLINIC                                     Cardiology Office Note:    Date:  04/05/2024   ID:  Larry Liu, DOB Dec 10, 1940, MRN [id number]  PCP:  Clinic, Bonni Lien  [facility] Cardiologist:  Lurena MARLA Red, MD  [facility] Structural heart: [name], MD [facility] Electrophysiologist:  None   Referring MD: Clinic, Bonni Lien   Pre TAVR visit.   History of Present Illness:    Larry Liu is a 84 y.o. male with a hx of T2DM, HLD, HTN, CKD IIIb, morbid obesity (BMI 44) and severe pLFLG AS who presents to clinic for follow up.  Admitted in 12/2023 for a syncopal episode. Also complained of fatigue and dyspnea. Echo 02/03/24 showed EF 55%, mild MS, and severe LFLG AS: mean grad 20 mmHg, Vmax 3.08 m/s, AVA 1.24 cm2, DVI 0.52, SVI 31. L/RHC 02/03/24 showed no significant CAD and moderate to severe aortic stenosis. AV mean gradient 36 mm Hg. AVA 1.19 cm squared with index 0.51. PCWP 17/15, mean 13 mm Hg. LVEDP 17 mm Hg. PAP 41/16 with mean 25 mm Hg. Normal cardiac output 7.2 L/min, index 3.1  CTs 02/12/24 showed anatomy amendable 26 mm Edwards S3 via the TF approach. He has been evaluated by the multidisciplinary valve team and felt to have severe, symptomatic aortic stenosis and to be a suitable candidate for TAVR, which was set up for 04/14/23.    Today the patient presents to clinic for follow up. He is here with his wife. He lives with his wife in a house. He drives, feeds and bathes himself. Walks with a cane - has had frequent falls, but none recently. He meets with a group of friends everyday at a different breakfast place. He is mostly limited by fatigue. Denies CP, SOB, LE edema, orthopnea or PND. He would like to have more energy. Watches TV all day. Would love to be able to get back on golf course.    Past Medical History:  Diagnosis Date   Diabetes mellitus without complication (HCC)    High  cholesterol    Hypertension    Obesity      Current Medications: Active Medications[1]    ROS:   Please see the history of present illness.    All other systems reviewed and are negative.  EKGs   EKG Interpretation Date/Time:  Monday April 05 2024 10:22:18 EST Ventricular Rate:  76 PR Interval:  174 QRS Duration:  78 QT Interval:  394 QTC Calculation: 443 R Axis:   12  Text Interpretation: Normal sinus rhythm When compared with ECG of 15-Jan-2024 14:18, Sinus rhythm has replaced Junctional rhythm Confirmed by Sebastian Pagan [phone]) on 04/05/2024 10:43:01 AM   Risk Assessment/Calculations:           Physical Exam:    VS:  BP 138/80   Pulse 76   Ht 6' (1.829 m)   Wt 261 lb 4.8 oz (118.5 kg)   SpO2 96%   BMI 35.44 kg/m     Wt Readings from Last 3 Encounters:  04/05/24 261 lb 4.8 oz (118.5 kg)  02/23/24 247 lb (112 kg)  02/03/24 247 lb (112 kg)     GEN: Well nourished, well developed in no acute distress, obese. NECK: No JVD  CARDIAC: RRR, 3/6 systolic murmur heard best at RUSB. No rubs, gallops RESPIRATORY:  Clear to auscultation without rales, wheezing or rhonchi  ABDOMEN: Soft, non-tender, non-distended EXTREMITIES:  No edema; No deformity.    ASSESSMENT:    1. Aortic stenosis, severe   2. Primary hypertension   3. Stage 3b chronic kidney disease (HCC)   4. BMI 40.0-44.9, adult (HCC)   5. Lesion of pancreas   6. Thyroid nodule   7. Pulmonary nodules     PLAN:    In order of problems listed above:  Severe AS:  -- Set up for TAVR-TF, on 04/14/23 with Dr. Wonda and Dr. Daniel. Noted to have less anterior calcium  on the left at the access site.  -- NYHA class II with mostly fatigue. -- Given instruction letter for PAT and TAVR.  HTN: -- BP 138/80.  -- Continue losartan 100mg  daily.   CKD stage IIIb: -- Creat baseline 1.78-1.98 -- Labs to be checked 04/09/24 at PAT.   Morbid obesity: -- Body mass index is 35.44 kg/m. -- Consider GLP 1  therapy in the future.   Lesion of pancreas: -- Noted on pre TAVR CT. 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  -- No further work up needed.   Thyroid nodules: -- Noted on TAVR CTs.  -- Consideration should be given toward further evaluation by dedicated thyroid ultrasound for definitive characterization.  -- This will be discussed after TAVR.   Pulmonary nodules: -- Noted on TAVR CTs. -- If patient is low risk for malignancy, no routine follow-up imaging is recommended. If patient is high risk for malignancy, a non-contrast chest CT at 12 months is optional.   -- This will be discussed after TAVR.                                     Medication Adjustments/Labs and Tests Ordered: Current medicines are reviewed at length with the patient today.  Concerns regarding medicines are outlined above.  Orders Placed This Encounter  Procedures   EKG 12-Lead   No orders of the defined types were placed in this encounter.   There are no Patient Instructions on file for this visit.   Signed, Lamarr Hummer, PA-C  04/05/2024 10:59 AM    [facility]     [1]  Current Meds  Medication Sig   amoxicillin (AMOXIL) 500 MG capsule Take 2,000 mg by mouth See admin instructions. Take 2,000 mg by mouth one hour prior to dental procedures   aspirin  EC 81 MG tablet Take 81 mg by mouth daily. (Patient taking differently: Take 81 mg by mouth daily. Only takes occasionally)   atenolol  (TENORMIN ) 25 MG tablet Take 1 tablet (25 mg total) by mouth daily.   DULoxetine  (CYMBALTA ) 30 MG capsule Take 30 mg by mouth daily.   fluticasone (FLONASE) 50 MCG/ACT nasal spray Place 2 sprays into the nose daily.   glipiZIDE (GLUCOTROL) 10 MG tablet Take 10 mg by mouth daily before breakfast.   latanoprost  (XALATAN ) 0.005 %  ophthalmic solution Place 1 drop into both eyes daily.   losartan (COZAAR) 100 MG tablet Take 100 mg by mouth daily.   naloxone (NARCAN) nasal spray 4 mg/0.1 mL Place into the nose.   rosuvastatin  (CRESTOR ) 40 MG  tablet Take 20 mg by mouth daily.   tamsulosin (FLOMAX) 0.4 MG CAPS capsule Take 0.4 mg by mouth at bedtime.   zolpidem  (AMBIEN ) 10 MG tablet Take 10 mg by mouth at bedtime.   "

## 2024-04-05 ENCOUNTER — Ambulatory Visit: Admitting: Physician Assistant

## 2024-04-05 VITALS — BP 138/80 | HR 76 | Ht 72.0 in | Wt 261.3 lb

## 2024-04-05 DIAGNOSIS — Z6841 Body Mass Index (BMI) 40.0 and over, adult: Secondary | ICD-10-CM

## 2024-04-05 DIAGNOSIS — K869 Disease of pancreas, unspecified: Secondary | ICD-10-CM

## 2024-04-05 DIAGNOSIS — N1832 Chronic kidney disease, stage 3b: Secondary | ICD-10-CM | POA: Diagnosis not present

## 2024-04-05 DIAGNOSIS — R918 Other nonspecific abnormal finding of lung field: Secondary | ICD-10-CM | POA: Diagnosis not present

## 2024-04-05 DIAGNOSIS — I10 Essential (primary) hypertension: Secondary | ICD-10-CM | POA: Diagnosis not present

## 2024-04-05 DIAGNOSIS — I350 Nonrheumatic aortic (valve) stenosis: Secondary | ICD-10-CM | POA: Diagnosis not present

## 2024-04-05 DIAGNOSIS — E041 Nontoxic single thyroid nodule: Secondary | ICD-10-CM

## 2024-04-05 NOTE — Progress Notes (Signed)
 Pre Surgical Assessment: 5 M Walk Test  75M=16.33ft  5 Meter Walk Test- trial 1: 9.17 seconds 5 Meter Walk Test- trial 2: 9.71 seconds 5 Meter Walk Test- trial 3: 9.92 seconds 5 Meter Walk Test Average: 9.60 seconds

## 2024-04-09 ENCOUNTER — Ambulatory Visit: Payer: Self-pay

## 2024-04-09 ENCOUNTER — Ambulatory Visit (HOSPITAL_COMMUNITY)
Admission: RE | Admit: 2024-04-09 | Discharge: 2024-04-09 | Disposition: A | Source: Ambulatory Visit | Attending: Cardiovascular Disease | Admitting: Cardiovascular Disease

## 2024-04-09 ENCOUNTER — Other Ambulatory Visit: Payer: Self-pay

## 2024-04-09 ENCOUNTER — Encounter (HOSPITAL_COMMUNITY)
Admission: RE | Admit: 2024-04-09 | Discharge: 2024-04-09 | Disposition: A | Source: Ambulatory Visit | Attending: Cardiovascular Disease

## 2024-04-09 DIAGNOSIS — Z01818 Encounter for other preprocedural examination: Secondary | ICD-10-CM

## 2024-04-09 DIAGNOSIS — I350 Nonrheumatic aortic (valve) stenosis: Secondary | ICD-10-CM | POA: Diagnosis present

## 2024-04-09 HISTORY — DX: Failed or difficult intubation, initial encounter: T88.4XXA

## 2024-04-09 LAB — TYPE AND SCREEN
ABO/RH(D): O POS
Antibody Screen: NEGATIVE

## 2024-04-09 LAB — URINALYSIS, ROUTINE W REFLEX MICROSCOPIC
Bilirubin Urine: NEGATIVE
Glucose, UA: NEGATIVE mg/dL
Hgb urine dipstick: NEGATIVE
Ketones, ur: NEGATIVE mg/dL
Leukocytes,Ua: NEGATIVE
Nitrite: NEGATIVE
Protein, ur: NEGATIVE mg/dL
Specific Gravity, Urine: 1.015 (ref 1.005–1.030)
pH: 6 (ref 5.0–8.0)

## 2024-04-09 LAB — COMPREHENSIVE METABOLIC PANEL WITH GFR
ALT: 188 U/L — ABNORMAL HIGH (ref 0–44)
AST: 75 U/L — ABNORMAL HIGH (ref 15–41)
Albumin: 3.9 g/dL (ref 3.5–5.0)
Alkaline Phosphatase: 74 U/L (ref 38–126)
Anion gap: 11 (ref 5–15)
BUN: 27 mg/dL — ABNORMAL HIGH (ref 8–23)
CO2: 25 mmol/L (ref 22–32)
Calcium: 9.1 mg/dL (ref 8.9–10.3)
Chloride: 104 mmol/L (ref 98–111)
Creatinine, Ser: 1.76 mg/dL — ABNORMAL HIGH (ref 0.61–1.24)
GFR, Estimated: 38 mL/min — ABNORMAL LOW
Glucose, Bld: 119 mg/dL — ABNORMAL HIGH (ref 70–99)
Potassium: 4 mmol/L (ref 3.5–5.1)
Sodium: 140 mmol/L (ref 135–145)
Total Bilirubin: 0.9 mg/dL (ref 0.0–1.2)
Total Protein: 7.2 g/dL (ref 6.5–8.1)

## 2024-04-09 LAB — CBC
HCT: 41.6 % (ref 39.0–52.0)
Hemoglobin: 13.7 g/dL (ref 13.0–17.0)
MCH: 28.8 pg (ref 26.0–34.0)
MCHC: 32.9 g/dL (ref 30.0–36.0)
MCV: 87.6 fL (ref 80.0–100.0)
Platelets: 167 K/uL (ref 150–400)
RBC: 4.75 MIL/uL (ref 4.22–5.81)
RDW: 13.8 % (ref 11.5–15.5)
WBC: 5.9 K/uL (ref 4.0–10.5)
nRBC: 0 % (ref 0.0–0.2)

## 2024-04-09 LAB — SURGICAL PCR SCREEN
MRSA, PCR: NEGATIVE
Staphylococcus aureus: NEGATIVE

## 2024-04-09 LAB — PROTIME-INR
INR: 1 (ref 0.8–1.2)
Prothrombin Time: 14.2 s (ref 11.4–15.2)

## 2024-04-09 NOTE — Progress Notes (Signed)
 All consents signed by patient at PAT lab appointment. Pt was sent home with printed copy of surgical instructions and CHG soap/CHG soap instructions. All instructions reviewed with patient and questions answered.  Patients chart send to anesthesia for review. Pt denies any respiratory illness/infection in the last two months.

## 2024-04-12 ENCOUNTER — Encounter (HOSPITAL_COMMUNITY): Payer: Self-pay

## 2024-04-12 MED ORDER — MAGNESIUM SULFATE 50 % IJ SOLN
40.0000 meq | INTRAMUSCULAR | Status: DC
  Filled 2024-04-12: qty 9.85

## 2024-04-12 MED ORDER — POTASSIUM CHLORIDE 2 MEQ/ML IV SOLN
80.0000 meq | INTRAVENOUS | Status: DC
  Filled 2024-04-12: qty 40

## 2024-04-12 MED ORDER — NOREPINEPHRINE 4 MG/250ML-% IV SOLN
0.0000 ug/min | INTRAVENOUS | Status: AC
  Administered 2024-04-13: 2 ug/min via INTRAVENOUS
  Filled 2024-04-12: qty 250

## 2024-04-12 MED ORDER — DEXMEDETOMIDINE HCL IN NACL 400 MCG/100ML IV SOLN
0.1000 ug/kg/h | INTRAVENOUS | Status: AC
  Administered 2024-04-13: .7 ug/kg/h via INTRAVENOUS
  Administered 2024-04-13: 118.52 ug via INTRAVENOUS
  Filled 2024-04-12: qty 100

## 2024-04-12 MED ORDER — HEPARIN 30,000 UNITS/1000 ML (OHS) CELLSAVER SOLUTION
Status: DC
  Filled 2024-04-12: qty 1000

## 2024-04-12 MED ORDER — CEFAZOLIN SODIUM-DEXTROSE 2-4 GM/100ML-% IV SOLN
2.0000 g | INTRAVENOUS | Status: AC
  Administered 2024-04-13: 2 g via INTRAVENOUS
  Filled 2024-04-12: qty 100

## 2024-04-13 ENCOUNTER — Inpatient Hospital Stay (HOSPITAL_COMMUNITY)
Admission: RE | Admit: 2024-04-13 | Discharge: 2024-04-14 | DRG: 267 | Disposition: A | Discharge status: Home / Self Care

## 2024-04-13 ENCOUNTER — Encounter (HOSPITAL_COMMUNITY): Payer: Self-pay | Admitting: Cardiovascular Disease

## 2024-04-13 ENCOUNTER — Other Ambulatory Visit: Payer: Self-pay

## 2024-04-13 ENCOUNTER — Encounter (HOSPITAL_COMMUNITY): Admitting: Physician Assistant

## 2024-04-13 ENCOUNTER — Inpatient Hospital Stay (HOSPITAL_COMMUNITY)

## 2024-04-13 ENCOUNTER — Inpatient Hospital Stay (HOSPITAL_COMMUNITY): Admitting: Certified Registered Nurse Anesthetist

## 2024-04-13 ENCOUNTER — Encounter (HOSPITAL_COMMUNITY): Admission: RE | Disposition: A | Payer: Self-pay | Source: Home / Self Care | Attending: Cardiovascular Disease

## 2024-04-13 DIAGNOSIS — R296 Repeated falls: Secondary | ICD-10-CM | POA: Diagnosis present

## 2024-04-13 DIAGNOSIS — I350 Nonrheumatic aortic (valve) stenosis: Secondary | ICD-10-CM | POA: Diagnosis present

## 2024-04-13 DIAGNOSIS — K861 Other chronic pancreatitis: Secondary | ICD-10-CM | POA: Diagnosis present

## 2024-04-13 DIAGNOSIS — Z952 Presence of prosthetic heart valve: Principal | ICD-10-CM

## 2024-04-13 DIAGNOSIS — I129 Hypertensive chronic kidney disease with stage 1 through stage 4 chronic kidney disease, or unspecified chronic kidney disease: Secondary | ICD-10-CM | POA: Diagnosis present

## 2024-04-13 DIAGNOSIS — Z006 Encounter for examination for normal comparison and control in clinical research program: Secondary | ICD-10-CM

## 2024-04-13 DIAGNOSIS — Z87891 Personal history of nicotine dependence: Secondary | ICD-10-CM

## 2024-04-13 DIAGNOSIS — I10 Essential (primary) hypertension: Secondary | ICD-10-CM | POA: Diagnosis present

## 2024-04-13 DIAGNOSIS — N1832 Chronic kidney disease, stage 3b: Secondary | ICD-10-CM | POA: Diagnosis present

## 2024-04-13 DIAGNOSIS — R918 Other nonspecific abnormal finding of lung field: Secondary | ICD-10-CM | POA: Diagnosis present

## 2024-04-13 DIAGNOSIS — E7800 Pure hypercholesterolemia, unspecified: Secondary | ICD-10-CM | POA: Diagnosis present

## 2024-04-13 DIAGNOSIS — Z6841 Body Mass Index (BMI) 40.0 and over, adult: Secondary | ICD-10-CM

## 2024-04-13 DIAGNOSIS — E042 Nontoxic multinodular goiter: Secondary | ICD-10-CM | POA: Diagnosis present

## 2024-04-13 DIAGNOSIS — E1129 Type 2 diabetes mellitus with other diabetic kidney complication: Secondary | ICD-10-CM | POA: Diagnosis present

## 2024-04-13 DIAGNOSIS — E1122 Type 2 diabetes mellitus with diabetic chronic kidney disease: Secondary | ICD-10-CM | POA: Diagnosis present

## 2024-04-13 HISTORY — PX: INTRAOPERATIVE TRANSTHORACIC ECHOCARDIOGRAM: SHX6523

## 2024-04-13 HISTORY — DX: Sleep apnea, unspecified: G47.30

## 2024-04-13 LAB — GLUCOSE, CAPILLARY
Glucose-Capillary: 120 mg/dL — ABNORMAL HIGH (ref 70–99)
Glucose-Capillary: 119 mg/dL — ABNORMAL HIGH (ref 70–99)
Glucose-Capillary: 142 mg/dL — ABNORMAL HIGH (ref 70–99)
Glucose-Capillary: 130 mg/dL — ABNORMAL HIGH (ref 70–99)
Glucose-Capillary: 167 mg/dL — ABNORMAL HIGH (ref 70–99)

## 2024-04-13 LAB — ECHOCARDIOGRAM LIMITED
AR max vel: 2.45 cm2
AV Area VTI: 2.6 cm2
AV Area mean vel: 1.59 cm2
AV Mean grad: 6 mmHg
AV Peak grad: 9.4 mmHg
Ao pk vel: 1.53 m/s

## 2024-04-13 LAB — POCT I-STAT, CHEM 8
BUN: 17 mg/dL (ref 8–23)
Calcium, Ion: 1.27 mmol/L (ref 1.15–1.40)
Chloride: 103 mmol/L (ref 98–111)
Creatinine, Ser: 1.5 mg/dL — ABNORMAL HIGH (ref 0.61–1.24)
Glucose, Bld: 154 mg/dL — ABNORMAL HIGH (ref 70–99)
HCT: 39 % (ref 39.0–52.0)
Hemoglobin: 13.3 g/dL (ref 13.0–17.0)
Potassium: 4.3 mmol/L (ref 3.5–5.1)
Sodium: 139 mmol/L (ref 135–145)
TCO2: 24 mmol/L (ref 22–32)

## 2024-04-13 LAB — ABO/RH: ABO/RH(D): O POS

## 2024-04-13 LAB — POCT ACTIVATED CLOTTING TIME: Activated Clotting Time: 327 s

## 2024-04-13 MED ORDER — SODIUM CHLORIDE 0.9% FLUSH: 3.0000 mL | INTRAVENOUS | Status: DC | PRN

## 2024-04-13 MED ORDER — CHLORHEXIDINE GLUCONATE 4 % EX SOLN: 60.0000 mL | Freq: Once | CUTANEOUS | Status: DC

## 2024-04-13 MED ORDER — CHLORHEXIDINE GLUCONATE 0.12 % MT SOLN
15.0000 mL | Freq: Once | OROMUCOSAL | Status: AC
  Administered 2024-04-13: 15 mL via OROMUCOSAL
  Filled 2024-04-13: qty 15

## 2024-04-13 MED ORDER — ZOLPIDEM TARTRATE 5 MG PO TABS
10.0000 mg | ORAL_TABLET | Freq: Every evening | ORAL | Status: DC | PRN
  Administered 2024-04-13: 10 mg via ORAL
  Filled 2024-04-13: qty 2

## 2024-04-13 MED ORDER — SODIUM CHLORIDE 0.9 % IV SOLN: 250.0000 mL | INTRAVENOUS | Status: DC | PRN

## 2024-04-13 MED ORDER — PROPOFOL 500 MG/50ML IV EMUL
INTRAVENOUS | Status: DC | PRN
  Administered 2024-04-13: 25 ug/kg/min via INTRAVENOUS

## 2024-04-13 MED ORDER — PROPOFOL 10 MG/ML IV BOLUS
INTRAVENOUS | Status: DC | PRN
  Administered 2024-04-13 (×4): 20 mg via INTRAVENOUS

## 2024-04-13 MED ORDER — LIDOCAINE HCL (PF) 1 % IJ SOLN
INTRAMUSCULAR | Status: AC
  Filled 2024-04-13: qty 30

## 2024-04-13 MED ORDER — TAMSULOSIN HCL 0.4 MG PO CAPS
0.4000 mg | ORAL_CAPSULE | Freq: Every day | ORAL | Status: DC
  Administered 2024-04-13: 0.4 mg via ORAL
  Filled 2024-04-13: qty 1

## 2024-04-13 MED ORDER — NITROGLYCERIN IN D5W 200-5 MCG/ML-% IV SOLN: 0.0000 ug/min | INTRAVENOUS | Status: DC

## 2024-04-13 MED ORDER — NOREPINEPHRINE 4 MG/250ML-% IV SOLN
0.0000 ug/min | INTRAVENOUS | Status: DC
  Filled 2024-04-13: qty 250

## 2024-04-13 MED ORDER — CEFAZOLIN SODIUM-DEXTROSE 2-4 GM/100ML-% IV SOLN
2.0000 g | Freq: Three times a day (TID) | INTRAVENOUS | Status: AC
  Administered 2024-04-13 – 2024-04-14 (×2): 2 g via INTRAVENOUS
  Filled 2024-04-13 (×2): qty 100

## 2024-04-13 MED ORDER — DULOXETINE HCL 30 MG PO CPEP
30.0000 mg | ORAL_CAPSULE | Freq: Every day | ORAL | Status: DC
  Administered 2024-04-13 – 2024-04-14 (×2): 30 mg via ORAL
  Filled 2024-04-13 (×2): qty 1

## 2024-04-13 MED ORDER — HEPARIN (PORCINE) IN NACL 2000-0.9 UNIT/L-% IV SOLN
INTRAVENOUS | Status: DC | PRN
  Administered 2024-04-13: 1000 mL

## 2024-04-13 MED ORDER — CHLORHEXIDINE GLUCONATE 0.12 % MT SOLN: 15.0000 mL | Freq: Once | OROMUCOSAL | Status: DC

## 2024-04-13 MED ORDER — TRAMADOL HCL 50 MG PO TABS
50.0000 mg | ORAL_TABLET | ORAL | Status: DC | PRN
  Administered 2024-04-13: 50 mg via ORAL
  Filled 2024-04-13: qty 1

## 2024-04-13 MED ORDER — SODIUM CHLORIDE 0.9% FLUSH
3.0000 mL | Freq: Two times a day (BID) | INTRAVENOUS | Status: DC
  Administered 2024-04-14 (×2): 3 mL via INTRAVENOUS

## 2024-04-13 MED ORDER — LIDOCAINE HCL (PF) 1 % IJ SOLN
INTRAMUSCULAR | Status: DC | PRN
  Administered 2024-04-13 (×2): 5 mL via INTRADERMAL

## 2024-04-13 MED ORDER — SODIUM CHLORIDE 0.9 % IV SOLN: INTRAVENOUS | Status: DC | PRN

## 2024-04-13 MED ORDER — ASPIRIN 81 MG PO TBEC
81.0000 mg | DELAYED_RELEASE_TABLET | Freq: Every day | ORAL | Status: DC
  Administered 2024-04-13 – 2024-04-14 (×2): 81 mg via ORAL
  Filled 2024-04-13 (×2): qty 1

## 2024-04-13 MED ORDER — ROSUVASTATIN CALCIUM 20 MG PO TABS
40.0000 mg | ORAL_TABLET | Freq: Every day | ORAL | Status: DC
  Administered 2024-04-13 – 2024-04-14 (×2): 40 mg via ORAL
  Filled 2024-04-13 (×2): qty 2

## 2024-04-13 MED ORDER — INSULIN ASPART 100 UNIT/ML IJ SOLN
0.0000 [IU] | Freq: Three times a day (TID) | INTRAMUSCULAR | Status: DC
  Administered 2024-04-13: 2 [IU] via SUBCUTANEOUS
  Filled 2024-04-13: qty 2

## 2024-04-13 MED ORDER — CHLORHEXIDINE GLUCONATE 4 % EX SOLN
60.0000 mL | Freq: Once | CUTANEOUS | Status: DC
  Filled 2024-04-13: qty 60

## 2024-04-13 MED ORDER — CHLORHEXIDINE GLUCONATE 4 % EX SOLN
30.0000 mL | CUTANEOUS | Status: DC
  Filled 2024-04-13: qty 30

## 2024-04-13 MED ORDER — MORPHINE SULFATE (PF) 2 MG/ML IV SOLN: 1.0000 mg | INTRAVENOUS | Status: DC | PRN

## 2024-04-13 MED ORDER — LATANOPROST 0.005 % OP SOLN
1.0000 [drp] | Freq: Every day | OPHTHALMIC | Status: DC
  Administered 2024-04-13: 1 [drp] via OPHTHALMIC
  Filled 2024-04-13: qty 2.5

## 2024-04-13 MED ORDER — HEPARIN SODIUM (PORCINE) 1000 UNIT/ML IJ SOLN
INTRAMUSCULAR | Status: DC | PRN
  Administered 2024-04-13: 18000 [IU] via INTRAVENOUS

## 2024-04-13 MED ORDER — PROTAMINE SULFATE 10 MG/ML IV SOLN
INTRAVENOUS | Status: DC | PRN
  Administered 2024-04-13: 50 mg via INTRAVENOUS

## 2024-04-13 MED ORDER — HEPARIN (PORCINE) IN NACL 1000-0.9 UT/500ML-% IV SOLN
INTRAVENOUS | Status: DC | PRN
  Administered 2024-04-13: 500 mL

## 2024-04-13 MED ORDER — ACETAMINOPHEN 325 MG PO TABS: 650.0000 mg | ORAL_TABLET | Freq: Four times a day (QID) | ORAL | Status: DC | PRN

## 2024-04-13 MED ORDER — NOREPINEPHRINE BITARTRATE 1 MG/ML IV SOLN
INTRAVENOUS | Status: DC | PRN
  Administered 2024-04-13 (×3): .5 mL via INTRAVENOUS

## 2024-04-13 MED ORDER — ONDANSETRON HCL 4 MG/2ML IJ SOLN: 4.0000 mg | Freq: Four times a day (QID) | INTRAMUSCULAR | Status: DC | PRN

## 2024-04-13 MED ORDER — PROTAMINE SULFATE 10 MG/ML IV SOLN
INTRAVENOUS | Status: AC
  Filled 2024-04-13: qty 5

## 2024-04-13 MED ORDER — SODIUM CHLORIDE 0.9 % IV SOLN: INTRAVENOUS | Status: DC

## 2024-04-13 MED ORDER — ACETAMINOPHEN 650 MG RE SUPP
650.0000 mg | Freq: Four times a day (QID) | RECTAL | Status: DC | PRN
  Filled 2024-04-13: qty 1

## 2024-04-13 MED ORDER — OXYCODONE HCL 5 MG PO TABS
5.0000 mg | ORAL_TABLET | ORAL | Status: DC | PRN
  Administered 2024-04-13: 10 mg via ORAL
  Filled 2024-04-13: qty 2

## 2024-04-13 MED ORDER — SODIUM CHLORIDE 0.9 % IV SOLN: INTRAVENOUS | Status: AC

## 2024-04-13 NOTE — Discharge Summary (Incomplete)
 " HEART AND VASCULAR CENTER   MULTIDISCIPLINARY HEART VALVE TEAM  Discharge Summary    Patient ID: Larry Liu MRN: [id number]; DOB: [birth year]  Admit date: 04/13/2024 Discharge date: 04/14/2024  PCP:  Clinic, Bonni Lien  [facility] Cardiologist:  Lurena MARLA Red, MD  [facility] Structural heart: [name], MD [facility] Electrophysiologist:  None   Discharge Diagnoses    Principal Problem:   S/P TAVR (transcatheter aortic valve replacement) Active Problems:   Hypertension   Type II diabetes mellitus with renal manifestations (HCC)   Morbid obesity (HCC)   Allergies Allergies[1]  Diagnostic Studies/Procedures    TAVR OPERATIVE NOTE     Date of Procedure:                04/13/2024   Preoperative Diagnosis:      Severe Aortic Stenosis    Postoperative Diagnosis:    Same    Procedure:        Transcatheter Aortic Valve Replacement - Percutaneous Transfemoral Approach             Edwards Sapien 3 Ultra Resilia THV (size 26 mm, serial # [id number])              Co-Surgeons:                        Con Clunes, MD and [name], MD   Anesthesiologist:                  Franky Bald, MD   Echocardiographer:              Toney Decent, MD   Pre-operative Echo Findings: Severe aortic stenosis Normal left ventricular systolic function   Post-operative Echo Findings: No paravalvular leak Normal left ventricular systolic function  _____________    Echo 04/14/24: completed but pending formal read at the time of discharge   History of Present Illness     Larry Liu is a 84 y.o. male with a history of T2DM, HLD, HTN, CKD IIIb, morbid obesity (BMI 35) and severe pLFLG AS who presented to [facility] on 04[facility]13[facility]24 for planned TAVR.   Admitted in 12/2023 for a syncopal episode. Also complained of fatigue and dyspnea. Echo 02/03/24 showed EF 55%, mild MS, and severe LFLG AS: mean grad 20 mmHg, Vmax 3.08 m/s, AVA 1.24 cm2, DVI 0.52, SVI 31. L/RHC 02/03/24 showed  no significant CAD and moderate to severe aortic stenosis. AV mean gradient 36 mm Hg. AVA 1.19 cm squared with index 0.51. PCWP 17/15, mean 13 mm Hg. LVEDP 17 mm Hg. PAP 41/16 with mean 25 mm Hg. Normal cardiac output 7.2 L/min, index 3.1    He was evaluated by the multidisciplinary valve team and felt to have severe, symptomatic aortic stenosis and to be a suitable candidate for TAVR, which was set up for 04/13/24.   Hospital Course     Consultants: none   Severe AS:  -- S/p TAVR with a 26 mm Edwards Sapien 3 Ultra Resilia THV via the TF approach on 04/13/24.  -- Post operative echo completed but pending formal read. -- Groin sites are stable.  -- ECG with sinus and no high grade heart block. -- Continue Asprin 81mg  daily. -- Met with cardiac rehab to discuss CRP phase II.  -- Plan for discharge home today with close follow up in the outpatient setting.   HTN: -- BP well controlled.  -- Continue losartan 100mg  daily and atenolol  25mg   daily.    CKD stage IIIb: -- Creat baseline 1.78-1.98 -- Creat improved to 1.4 at discharge.   DMT2:  -- Treated with SSI while admitted.  -- Resume home meds at discharge.    Morbid obesity: -- Body mass index is 36 kg/m.  Transaminatits: -- PAT labs showed AST/ALT 75/188.  -- Will repeat in the office next week and hold or reduce statin if persistently elevated.   Lesion of pancreas: -- Noted on pre TAVR CT. 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  -- No further work up needed.    Thyroid nodules: -- Noted on TAVR CTs.  -- Consideration should be given toward further evaluation by dedicated thyroid ultrasound for definitive characterization.  -- This will be discussed in the outpatient setting.    Pulmonary nodules: -- Noted on TAVR CTs. -- If patient is low risk for  malignancy, no routine follow-up imaging is recommended. If patient is high risk for malignancy, a non-contrast chest CT at 12 months is optional.   --This will be discussed in the outpatient setting.    _____________  Discharge Vitals Blood pressure 138/71, pulse 72, temperature 98 F (36.7 C), temperature source Oral, resp. rate 17, height 6' (1.829 m), weight 121.6 kg, SpO2 96%.  Filed Weights   04/13/24 1030 04/14/24 0500  Weight: 118.5 kg 121.6 kg     GEN: Well nourished, well developed in no acute distress NECK: No JVD CARDIAC: RRR, soft flow murmur @ RUSB. No rubs, gallops RESPIRATORY:  Clear to auscultation without rales, wheezing or rhonchi  ABDOMEN: Soft, non-tender, non-distended EXTREMITIES:  No edema; No deformity.  Groin sites clear without hematoma or ecchymosis.    Disposition   Pt is being discharged home today in good condition.  Follow-up Plans & Appointments     Follow-up Information     Sebastian Lamarr SAUNDERS, PA-C. Go on 04/19/2024.   Specialties: Cardiology, Radiology Why: @ 9:05 am, please arrive at least 20 minutes early. Contact information: [address] [place] [address] [phone]                Discharge Instructions     Amb Referral to Cardiac Rehabilitation   Complete by: As directed    Diagnosis: Valve Replacement   Valve: Aortic Comment - TAVR   After initial evaluation and assessments completed: Virtual Based Care may be provided alone or in conjunction with Phase 2 Cardiac Rehab based on patient barriers.: Yes   Intensive Cardiac Rehabilitation (ICR) [facility] location only OR Traditional Cardiac Rehabilitation (TCR) *If criteria for ICR are not met will enroll in TCR [facility] only): Yes       Discharge Medications   Allergies as of 04/14/2024       Reactions   Lisinopril Dermatitis, Rash, Other (See Comments), Cough    Eruption   Hydrocodone  Nausea And Vomiting        Medication List     TAKE these  medications    aspirin  EC 81 MG tablet Take 81 mg by mouth daily.   atenolol  25 MG tablet Commonly known as: TENORMIN  Take 1 tablet (25 mg total) by mouth daily.   DULoxetine  30 MG capsule Commonly known as: CYMBALTA  Take 30 mg by mouth daily.   fluticasone 50 MCG/ACT nasal spray Commonly known as: FLONASE Place 2 sprays into the nose daily as needed for  allergies.   glipiZIDE 10 MG tablet Commonly known as: GLUCOTROL Take 10 mg by mouth daily before breakfast.   latanoprost  0.005 % ophthalmic solution Commonly known as: XALATAN  Place 1 drop into both eyes at bedtime.   losartan 100 MG tablet Commonly known as: COZAAR Take 100 mg by mouth daily.   naloxone 4 MG/0.1ML Liqd nasal spray kit Commonly known as: NARCAN Place into the nose.   rosuvastatin  40 MG tablet Commonly known as: CRESTOR  Take 40 mg by mouth daily.   tamsulosin  0.4 MG Caps capsule Commonly known as: FLOMAX  Take 0.4 mg by mouth at bedtime.   zolpidem  10 MG tablet Commonly known as: AMBIEN  Take 10 mg by mouth at bedtime as needed for sleep.          Outstanding Labs/Studies   CMP  ______________________  Duration of Discharge Encounter: APP Time: 31 minutes    Signed, Lamarr Hummer, PA-C 04/14/2024, 11:23 AM [id number]      [1]  Allergies Allergen Reactions   Lisinopril Dermatitis, Rash, Other (See Comments) and Cough     Eruption   Hydrocodone  Nausea And Vomiting   "

## 2024-04-13 NOTE — Progress Notes (Signed)
Pt arrived from .[facility].., A/ox .4.Marland Kitchenpt denies any pain, MD aware,CCMD called. CHG bath given,no further needs at this time

## 2024-04-13 NOTE — Plan of Care (Signed)
   Problem: Education: Goal: Knowledge of General Education information will improve Description Including pain rating scale, medication(s)/side effects and non-pharmacologic comfort measures Outcome: Progressing

## 2024-04-13 NOTE — Discharge Instructions (Signed)

## 2024-04-13 NOTE — Progress Notes (Signed)
  HEART AND VASCULAR CENTER   MULTIDISCIPLINARY HEART VALVE TEAM  Patient doing well s/p TAVR. He is hemodynamically stable. Groin sites stable. ECG with sinus and no high grade block. Transferred from cath lab holding to 4E. Early ambulation after bedrest completed and hopeful discharge over the next 24-48 hours.   Lamarr Hummer PA-C  [facility]  Pager [phone]

## 2024-04-13 NOTE — Op Note (Signed)
 " HEART AND VASCULAR CENTER   MULTIDISCIPLINARY HEART VALVE TEAM   TAVR OPERATIVE NOTE   Date of Procedure:  04/13/2024  Preoperative Diagnosis: Severe Aortic Stenosis   Postoperative Diagnosis: Same   Procedure:   Transcatheter Aortic Valve Replacement - Percutaneous Transfemoral Approach  Edwards Sapien 3 Ultra Resilia THV (size 26 mm, serial # [id number]    Co-Surgeons:  Con Clunes, MD and [name], MD  Anesthesiologist:  Franky Bald, MD  Echocardiographer:  Toney Decent, MD  Pre-operative Echo Findings: Severe aortic stenosis Normal left ventricular systolic function  Post-operative Echo Findings: No paravalvular leak Normal left ventricular systolic function   BRIEF CLINICAL NOTE AND INDICATIONS FOR SURGERY Larry Liu is a 84 y.o. male presents for evaluation of severe aortic stenosis and TAVR. Recently had syncopal episode, fatigue and dyspnea.  No syncope since going home.  Lives with his wife in a house.  He drives, feeds and bathes himself.  Walks with a cane - has had frequent falls.  Former smoker, quit 20 years ago.  Has partial dentures, sees dentist regularly at the [facility].   DETAILS OF THE OPERATIVE PROCEDURE  PREPARATION:    The patient was brought to the operating room on the above mentioned date and appropriate monitoring was established by the anesthesia team. The patient was placed in the supine position on the operating table.  Intravenous antibiotics were administered. The patient was monitored closely throughout the procedure under conscious sedation.  Baseline transthoracic echocardiogram was performed. The patient's abdomen and both groins were prepped and draped in a sterile manner. A time out procedure was performed.   PERIPHERAL ACCESS:    Using the modified Seldinger technique, femoral arterial and venous access was obtained with placement of 6 Fr sheaths on the left side.  A pigtail diagnostic catheter was passed through the left femoral  arterial sheath under fluoroscopic guidance into the aortic root.  A temporary transvenous pacemaker catheter was passed through the right femoral venous sheath under fluoroscopic guidance into the right ventricle.  The pacemaker was tested to ensure stable lead placement and pacemaker capture. Aortic root angiography was performed in order to determine the optimal angiographic angle for valve deployment.   TRANSFEMORAL ACCESS:   Percutaneous transfemoral access and sheath placement was performed using ultrasound guidance.  The right common femoral artery was cannulated using a micropuncture needle and appropriate location was verified using hand injection angiogram.  A pair of Abbott Perclose percutaneous closure devices were placed and a 6 French sheath replaced into the femoral artery.  The patient was heparinized systemically and ACT verified > 250 seconds.    A 14 Fr transfemoral E-sheath was introduced into the right common femoral artery after progressively dilating over an Amplatz superstiff wire. An AL-2 catheter was used to direct a straight-tip exchange length wire across the native aortic valve into the left ventricle. This was exchanged out for a pigtail catheter and position was confirmed in the LV apex. Simultaneous LV and Ao pressures were recorded.  The LVEDP was 14 mmHg.  The pigtail catheter was exchanged for a Safari wire in the LV apex.    TRANSCATHETER HEART VALVE DEPLOYMENT:   An Edwards Sapien 3 Ultra transcatheter heart valve (26 mm) was prepared and crimped per manufacturer's guidelines, and the proper orientation of the valve was confirmed on the [employer] delivery system. The valve was advanced through the introducer sheath using normal technique until in an appropriate position in the abdominal aorta beyond the sheath  tip. The balloon was then retracted and using the fine-tuning wheel was centered on the valve. The valve was then advanced across the aortic arch using  appropriate flexion of the catheter. The valve was carefully positioned across the aortic valve annulus. The Commander catheter was retracted using normal technique. Once final position of the valve was confirmed by angiographic assessment, the valve was deployed during rapid ventricular pacing to maintain systolic blood pressure < 50 mmHg and pulse pressure < 10 mmHg. The balloon inflation was held for >3 seconds after reaching full deployment volume. Once the balloon was fully deflated the balloon was retracted into the ascending aorta and valve function was assessed using echocardiography. There was felt to be no paravalvular leak and no central aortic insufficiency.  The patient's hemodynamic recovery following valve deployment was good.  The deployment balloon and guidewire were both removed.    PROCEDURE COMPLETION:   The sheath was removed and femoral artery closure performed.  Protamine  was administered once femoral arterial repair was complete. The temporary pacemaker, pigtail catheter and femoral sheaths were removed with manual pressure used for venous hemostasis.  A Mynx femoral closure device was utilized following removal of the diagnostic sheath in the left femoral artery.  The patient tolerated the procedure well and was transported to the cath lab recovery area in stable condition. There were no immediate intraoperative complications. All sponge instrument and needle counts were verified correct at completion of the operation.   No blood products were administered during the operation.  The patient received a total of 50 mL of intravenous contrast during the procedure.   Con GORMAN Clunes, MD 04/13/2024 9:10 PM      "

## 2024-04-13 NOTE — Transfer of Care (Signed)
 Immediate Anesthesia Transfer of Care Note  Patient: Larry Liu  Procedure(s) Performed: Transcatheter Aortic Valve Replacement, Transfemoral ECHOCARDIOGRAM, TRANSTHORACIC  Patient Location: Cath Lab  Anesthesia Type:MAC  Level of Consciousness: awake, drowsy, and patient cooperative  Airway & Oxygen Therapy: Patient Spontanous Breathing  Post-op Assessment: Report given to RN and Post -op Vital signs reviewed and stable  Post vital signs: Reviewed and stable  Last Vitals:  Vitals Value Taken Time  BP 114/67 1616  Temp    Pulse 75   Resp 18   SpO2 97%     Last Pain:  Vitals:   04/13/24 1426  TempSrc:   PainSc: 0-No pain      Patients Stated Pain Goal: 0 (04/13/24 1035)  Complications: There were no known notable events for this encounter.

## 2024-04-13 NOTE — Anesthesia Preprocedure Evaluation (Signed)
"                                    Anesthesia Evaluation  Patient identified by MRN, date of birth, ID band Patient awake    Reviewed: Allergy & Precautions, NPO status , Patient's Chart, lab work & pertinent test results  History of Anesthesia Complications (+) DIFFICULT AIRWAY and history of anesthetic complications  Airway Mallampati: II  TM Distance: >3 FB Neck ROM: Full    Dental  (+) Teeth Intact, Dental Advisory Given   Pulmonary sleep apnea , former smoker   breath sounds clear to auscultation       Cardiovascular hypertension, Pt. on home beta blockers  Rhythm:Regular Rate:Normal     Neuro/Psych negative neurological ROS  negative psych ROS   GI/Hepatic negative GI ROS, Neg liver ROS,,,  Endo/Other  diabetes, Type 2, Oral Hypoglycemic Agents    Renal/GU Renal disease     Musculoskeletal negative musculoskeletal ROS (+)    Abdominal   Peds  Hematology negative hematology ROS (+)   Anesthesia Other Findings   Reproductive/Obstetrics                              Anesthesia Physical Anesthesia Plan  ASA: 4  Anesthesia Plan: MAC   Post-op Pain Management: Minimal or no pain anticipated   Induction: Intravenous  PONV Risk Score and Plan: 0 and Propofol  infusion  Airway Management Planned: Simple Face Mask and Natural Airway  Additional Equipment: None and Arterial line  Intra-op Plan:   Post-operative Plan:   Informed Consent: I have reviewed the patients History and Physical, chart, labs and discussed the procedure including the risks, benefits and alternatives for the proposed anesthesia with the patient or authorized representative who has indicated his/her understanding and acceptance.       Plan Discussed with: CRNA  Anesthesia Plan Comments:         Anesthesia Quick Evaluation  "

## 2024-04-13 NOTE — Anesthesia Procedure Notes (Signed)
 Arterial Line Insertion Start/End1/13/2026 10:30 AM, 04/13/2024 10:38 AM Performed by: Zelphia Norleen HERO, CRNA, CRNA  Patient location: Pre-op. Preanesthetic checklist: patient identified, IV checked, site marked, risks and benefits discussed, surgical consent, monitors and equipment checked, pre-op evaluation, timeout performed and anesthesia consent Lidocaine  1% used for infiltration Left, radial was placed Catheter size: 20 G Hand hygiene performed  and maximum sterile barriers used   Attempts: 1 Procedure performed using ultrasound to evaluate access site. Ultrasound Notes:relevant anatomy identified, ultrasound used to visualize needle entry and vessel patent under ultrasound. Following insertion, dressing applied. Post procedure assessment: normal and unchanged  Patient tolerated the procedure well with no immediate complications.

## 2024-04-13 NOTE — Anesthesia Postprocedure Evaluation (Signed)
"   Anesthesia Post Note  Patient: Larry Liu  Procedure(s) Performed: Transcatheter Aortic Valve Replacement, Transfemoral ECHOCARDIOGRAM, TRANSTHORACIC     Patient location during evaluation: PACU Anesthesia Type: MAC Level of consciousness: awake and alert Pain management: pain level controlled Vital Signs Assessment: post-procedure vital signs reviewed and stable Respiratory status: spontaneous breathing, nonlabored ventilation, respiratory function stable and patient connected to nasal cannula oxygen Cardiovascular status: stable and blood pressure returned to baseline Postop Assessment: no apparent nausea or vomiting Anesthetic complications: no   There were no known notable events for this encounter.  Last Vitals:  Vitals:   04/13/24 1715 04/13/24 1730  BP: 95/64 (!) 93/52  Pulse: 65 64  Resp: 17 17  Temp:    SpO2: 96% 96%    Last Pain:  Vitals:   04/13/24 1702  TempSrc: Oral  PainSc: 0-No pain                 [name]      "

## 2024-04-13 NOTE — Interval H&P Note (Signed)
 History and Physical Interval Note:  04/13/2024 2:20 PM  Larry Liu  has presented today for surgery, with the diagnosis of Severe Aortic Stenosis.  The various methods of treatment have been discussed with the patient and family. After consideration of risks, benefits and other options for treatment, the patient has consented to  Procedures: Transcatheter Aortic Valve Replacement, Transfemoral (N/A) ECHOCARDIOGRAM, TRANSTHORACIC (N/A) as a surgical intervention.  The patient's history has been reviewed, patient examined, no change in status, stable for surgery.  I have reviewed the patient's chart and labs.  Questions were answered to the patient's satisfaction.     [name]

## 2024-04-13 NOTE — Op Note (Signed)
 " HEART AND VASCULAR CENTER   MULTIDISCIPLINARY HEART VALVE TEAM   TAVR OPERATIVE NOTE   Date of Procedure:  04/13/2024  Preoperative Diagnosis: Severe Aortic Stenosis   Postoperative Diagnosis: Same   Procedure:   Transcatheter Aortic Valve Replacement - Percutaneous Transfemoral Approach  Edwards Sapien 3 Ultra Resilia THV (size 26 mm, serial # [id number])   Co-Surgeons:  Con Clunes, MD and [name], MD  Anesthesiologist:  Franky Bald, MD  Echocardiographer:  Toney Decent, MD  Pre-operative Echo Findings: Severe aortic stenosis Normal left ventricular systolic function  Post-operative Echo Findings: No paravalvular leak Normal left ventricular systolic function  BRIEF CLINICAL NOTE AND INDICATIONS FOR SURGERY  84 year old male with diabetes, hypertension, stage IIIb chronic kidney disease, morbid obesity who has developed severe paradoxical low-flow low gradient aortic stenosis.  The patient presents today for definitive treatment with plans to proceed with transfemoral TAVR using a 26 mm SAPIEN 3 valve.  During the course of the patient's preoperative work up they have been evaluated comprehensively by a multidisciplinary team of specialists coordinated through the Multidisciplinary Heart Valve Clinic in the [facility].  They have been demonstrated to suffer from symptomatic severe aortic stenosis as noted above. The patient has been counseled extensively as to the relative risks and benefits of all options for the treatment of severe aortic stenosis including long term medical therapy, conventional surgery for aortic valve replacement, and transcatheter aortic valve replacement.  The patient has been independently evaluated in formal cardiac surgical consultation by Dr Clunes, who deemed the patient appropriate for TAVR. Based upon review of all of the patient's preoperative diagnostic tests they are felt to be candidate for transcatheter aortic valve  replacement using the transfemoral approach as an alternative to conventional surgery.    Following the decision to proceed with transcatheter aortic valve replacement, a discussion has been held regarding what types of management strategies would be attempted intraoperatively in the event of life-threatening complications, including whether or not the patient would be considered a candidate for the use of cardiopulmonary bypass and/or conversion to open sternotomy for attempted surgical intervention.  The patient has been advised of a variety of complications that might develop peculiar to this approach including but not limited to risks of death, stroke, paravalvular leak, aortic dissection or other major vascular complications, aortic annulus rupture, device embolization, cardiac rupture or perforation, acute myocardial infarction, arrhythmia, heart block or bradycardia requiring permanent pacemaker placement, congestive heart failure, respiratory failure, renal failure, pneumonia, infection, other late complications related to structural valve deterioration or migration, or other complications that might ultimately cause a temporary or permanent loss of functional independence or other long term morbidity.  The patient provides full informed consent for the procedure as described and all questions were answered preoperatively.  DETAILS OF THE OPERATIVE PROCEDURE  PREPARATION:   The patient is brought to the operating room on the above mentioned date. The patient is placed in the supine position on the operating table.  Intravenous antibiotics are administered. The patient is monitored closely throughout the procedure under conscious sedation.   Baseline transthoracic echocardiogram is performed. The patient's chest, abdomen, both groins, and both lower extremities are prepared and draped in a sterile manner. A time out procedure is performed.   PERIPHERAL ACCESS:   Using ultrasound guidance, femoral  arterial and venous access is obtained with placement of 6 Fr sheaths on the left side.  US  images are digitally captured and stored in the patient's chart.  A pigtail diagnostic catheter was passed through the femoral arterial sheath under fluoroscopic guidance into the aortic root.  A temporary transvenous pacemaker catheter was passed through the femoral venous sheath under fluoroscopic guidance into the right ventricle.  The pacemaker was tested to ensure stable lead placement and pacemaker capture. Aortic root angiography was performed in order to determine the optimal angiographic angle for valve deployment.  TRANSFEMORAL ACCESS:  A micropuncture technique is used to access the right femoral artery under fluoroscopic and ultrasound guidance.  2 Perclose devices are deployed at 10' and 2' positions to 'PreClose' the femoral artery. An 8 French sheath is placed and then an Amplatz Superstiff wire is advanced through the sheath. This is changed out for a 14 French transfemoral E-Sheath after progressively dilating over the Superstiff wire.  An AL-2 catheter was used to direct a straight-tip exchange length wire across the native aortic valve into the left ventricle. This was exchanged out for a pigtail catheter and position was confirmed in the LV apex. Simultaneous LV and Ao pressures were recorded.  The pigtail catheter was exchanged for a Safari wire in the LV apex.    BALLOON AORTIC VALVULOPLASTY:  Not performed  TRANSCATHETER HEART VALVE DEPLOYMENT:  An Edwards Sapien 3 Ultra Resilia transcatheter heart valve (size 26 mm) was prepared and crimped per manufacturer's guidelines, and the proper orientation of the valve is confirmed on the [employer] delivery system. The valve was advanced through the introducer sheath using normal technique until in an appropriate position in the abdominal aorta beyond the sheath tip. The balloon was then retracted and using the fine-tuning wheel was centered on  the valve. The valve was then advanced across the aortic arch using appropriate flexion of the catheter. The valve was carefully positioned across the aortic valve annulus. The Commander catheter was retracted using normal technique. Once final position of the valve has been confirmed by angiographic assessment, the valve is deployed while temporarily holding ventilation and during rapid ventricular pacing to maintain systolic blood pressure < 50 mmHg and pulse pressure < 10 mmHg. The balloon inflation is held for >3 seconds after reaching full deployment volume. Once the balloon has fully deflated the balloon is retracted into the ascending aorta and valve function is assessed using echocardiography. The patient's hemodynamic recovery following valve deployment is good.  The deployment balloon and guidewire are both removed. Echo demostrated acceptable post-procedural gradients, stable mitral valve function, and no aortic insufficiency.    PROCEDURE COMPLETION:  The sheath was removed and femoral artery closure is performed using the 2 previously deployed Perclose devices.  Protamine  is administered once femoral arterial repair was complete. The site is clear with no evidence of bleeding or hematoma after the sutures are tightened. The temporary pacemaker and pigtail catheters are removed. Mynx closure is used for contralateral femoral arterial hemostasis for the 6 Fr sheath.  The patient tolerated the procedure well and is transported to the recovery area in stable condition. There were no immediate intraoperative complications. All sponge instrument and needle counts are verified correct at completion of the operation.   The patient received a total of 50 mL of intravenous contrast during the procedure.  EBL: minimal  LVEDP: 14 mmHg   [name], MD 04/13/2024 4:38 PM  "

## 2024-04-14 ENCOUNTER — Inpatient Hospital Stay (HOSPITAL_COMMUNITY)

## 2024-04-14 ENCOUNTER — Other Ambulatory Visit: Payer: Self-pay

## 2024-04-14 DIAGNOSIS — Z952 Presence of prosthetic heart valve: Secondary | ICD-10-CM

## 2024-04-14 LAB — BASIC METABOLIC PANEL WITH GFR
Anion gap: 11 (ref 5–15)
BUN: 20 mg/dL (ref 8–23)
CO2: 22 mmol/L (ref 22–32)
Calcium: 9 mg/dL (ref 8.9–10.3)
Chloride: 104 mmol/L (ref 98–111)
Creatinine, Ser: 1.4 mg/dL — ABNORMAL HIGH (ref 0.61–1.24)
GFR, Estimated: 50 mL/min — ABNORMAL LOW
Glucose, Bld: 126 mg/dL — ABNORMAL HIGH (ref 70–99)
Potassium: 4.1 mmol/L (ref 3.5–5.1)
Sodium: 136 mmol/L (ref 135–145)

## 2024-04-14 LAB — ECHOCARDIOGRAM COMPLETE
Area-P 1/2: 3.27 cm2
Est EF: >75
Height: 72 in
S' Lateral: 1.9 cm
Single Plane A4C EF: 68.4 %
Weight: 4289.27 [oz_av]

## 2024-04-14 LAB — GLUCOSE, CAPILLARY: Glucose-Capillary: 121 mg/dL — ABNORMAL HIGH (ref 70–99)

## 2024-04-14 LAB — CBC
HCT: 35.9 % — ABNORMAL LOW (ref 39.0–52.0)
Hemoglobin: 12.3 g/dL — ABNORMAL LOW (ref 13.0–17.0)
MCH: 29 pg (ref 26.0–34.0)
MCHC: 34.3 g/dL (ref 30.0–36.0)
MCV: 84.7 fL (ref 80.0–100.0)
Platelets: 151 K/uL (ref 150–400)
RBC: 4.24 MIL/uL (ref 4.22–5.81)
RDW: 13.4 % (ref 11.5–15.5)
WBC: 9.1 K/uL (ref 4.0–10.5)
nRBC: 0 % (ref 0.0–0.2)

## 2024-04-14 LAB — MAGNESIUM: Magnesium: 1.8 mg/dL (ref 1.7–2.4)

## 2024-04-14 NOTE — Progress Notes (Addendum)
 CARDIAC REHAB PHASE I    Post TAVR education including site care, restrictions, risk factors, heart healthy diabetic diet, exercise guidelines, and CRP2 reviewed. Patient interested in [facility] CRP2. Will refer. All questions and concerns addressed.    [id number] Larry Liu [place], RN BSN 04/14/2024 9:48 AM

## 2024-04-14 NOTE — Progress Notes (Signed)
" °  Echocardiogram 2D Echocardiogram has been performed.  Tinnie FORBES Gosling RDCS 04/14/2024, 9:50 AM "

## 2024-04-14 NOTE — Progress Notes (Signed)
 Mobility Specialist Progress Note;   04/14/24 1004  Mobility  Activity Ambulated with assistance  Level of Assistance Contact guard assist, steadying assist  Assistive Device Other (Comment) (IV pole)  Distance Ambulated (ft) 200 ft  Activity Response Tolerated well  Mobility Referral Yes  Mobility visit 1 Mobility  Mobility Specialist Start Time (ACUTE ONLY) 1004  Mobility Specialist Stop Time (ACUTE ONLY) 1012  Mobility Specialist Time Calculation (min) (ACUTE ONLY) 8 min   Pt agreeable to mobility. Required light MinG assistance during ambulation for safety. VSS throughout and no c/o when asked. Requested to use BR at [facility]. Pt instructed to pull call light once finished. Left with all needs met.   Lauraine Erm Mobility Specialist Please contact via SecureChat or [employer] [phone]

## 2024-04-15 ENCOUNTER — Telehealth: Payer: Self-pay

## 2024-04-15 NOTE — Telephone Encounter (Signed)
 I attempted to contact patient for [facility] documentation and left message asking patient to return call.

## 2024-04-16 NOTE — Progress Notes (Unsigned)
 " HEART AND VASCULAR CENTER   MULTIDISCIPLINARY HEART VALVE CLINIC                                     Cardiology Office Note:    Date:  04/19/2024   ID:  Larry Liu, DOB [birth year], MRN [id number]  PCP:  Clinic, Bonni Lien  [facility] Cardiologist:  Lurena MARLA Red, MD  [facility] Structural heart: [name], MD [facility] Electrophysiologist:  None   Referring MD: Clinic, Bonni Lien   [facility] s/p TAVR  History of Present Illness:    Larry Liu is a 84 y.o. 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   Admitted in 12/2023 for a syncopal episode. Also complained of fatigue and dyspnea. Echo 02/03/24 showed EF 55%, mild MS, and severe LFLG AS: mean grad 20 mmHg, Vmax 3.08 m/s, AVA 1.24 cm2, DVI 0.52, SVI 31. L/RHC 02/03/24 showed no significant CAD and moderate to severe aortic stenosis. S/p TAVR with a 26 mm Edwards Sapien 3 Ultra Resilia THV via the TF approach on 04/13/24. Post operative echo showed EF >75%, normally functioning TAVR with a mean gradient of 10 mmHg and no PVL.  Today the patient presents to clinic for follow up. Here alone. Not having any issues since TAVR. Able to  move around a bit better. No CP or SOB. No LE edema, orthopnea or PND. No dizziness or syncope. No blood in stool or urine. No palpitations.    Past Medical History:  Diagnosis Date   Diabetes mellitus without complication (HCC)    Difficult intubation    Per records, pt wears medical bracelet stating difficult intubation   High cholesterol    Hypertension    Obesity    Sleep apnea      Current Medications: Active Medications[1]    ROS:   Please see the history of present illness.    All other systems reviewed and are negative.  EKGs   EKG Interpretation Date/Time:  Monday April 19 2024 08:56:09 EST Ventricular Rate:  72 PR Interval:  166 QRS  Duration:  94 QT Interval:  410 QTC Calculation: 448 R Axis:   -2  Text Interpretation: Normal sinus rhythm Incomplete right bundle branch block Confirmed by Sebastian Pagan [phone]) on 04/19/2024 9:13:16 AM   Risk Assessment/Calculations:           Physical Exam:    VS:  BP (!) 110/58   Pulse 72   Ht 6' (1.829 m)   Wt 260 lb (117.9 kg)   SpO2 98%   BMI 35.26 kg/m     Wt Readings from Last 3 Encounters:  04/19/24 260 lb (117.9 kg)  04/14/24 268 lb 1.3 oz (121.6 kg)  04/05/24 261 lb 4.8 oz (118.5 kg)     GEN: Well nourished, well developed in no acute distress NECK: No JVD CARDIAC: RRR, no murmurs, rubs, gallops RESPIRATORY:  Clear to auscultation without rales, wheezing or rhonchi  ABDOMEN: Soft, non-tender, non-distended EXTREMITIES:  No edema; No deformity.  Groin sites clear without hematoma or ecchymosis.   ASSESSMENT:    1. S/P TAVR (transcatheter aortic valve replacement)   2. Primary hypertension   3. CKD stage 3b, GFR 30-44 ml/min (HCC)   4. Elevated LFTs   5. Lesion of pancreas  6. Thyroid nodule   7. Pulmonary nodules     PLAN:    In order of problems listed above:  Severe AS s/p TAVR:  -- Pt doing well s/p TAVR.  -- ECG with no HAVB.  -- Groin sites healing well.  -- SBE prophylaxis discussed; I have RX'd amoxicillin .   -- Continue Aspirin  81mg  daily. -- Cleared to resume all activities without restriction. -- I will see back for 1 month echo and OV.  HTN: -- BP well controlled.  -- Continue losartan 100mg  daily and atenolol  25mg  daily.    CKD stage IIIb: -- Creat baseline 1.78-1.98 -- Creat improved to 1.4 at discharge.    Transaminatits: -- PAT labs showed AST/ALT 75/188.  -- Repeat today and hold or reduce statin if persistently elevated.    Lesion of pancreas: -- Noted on pre TAVR CT. 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  -- No further work up needed.    Thyroid nodules: -- Noted on TAVR CTs.  -- Consideration should be given toward further evaluation by dedicated thyroid ultrasound for definitive characterization.  -- This was printed out for the pt to follow up with his PCP.   Pulmonary nodules: -- Noted on TAVR CTs. -- If patient is low risk for malignancy, no routine follow-up imaging is recommended. If patient is high risk for malignancy, a non-contrast chest CT at 12 months is optional.   --Has a long smoking history so we will get a CT in 1 year.       Cardiac Rehabilitation Eligibility Assessment  The patient is ready to start cardiac rehabilitation from a cardiac standpoint.      Medication Adjustments/Labs and Tests Ordered: Current medicines are reviewed at length with the patient today.  Concerns regarding medicines are outlined above.  Orders Placed This Encounter  Procedures   Comp Met (CMET)   EKG 12-Lead   ECHOCARDIOGRAM COMPLETE   Meds ordered this encounter  Medications   amoxicillin  (AMOXIL ) 500 MG tablet    Sig: Take 4 tablets (2,000 mg total) by mouth as directed. 1 hour prior to dental work including cleanings    Dispense:  12 tablet    Refill:  12    Supervising Provider:   WONDA SHARPER [[id number]]    Patient Instructions  Medication Instructions:  Your physician has recommended you make the following change in your medication:  START Amoxicillin  500 mg, take 4 tablets by mouth 1 hour prior to dental procedures and cleanings.    *If you need a refill on your cardiac medications before your next appointment, please call your pharmacy*  Lab Work: TODAY, 04/1924 CMP If you have labs (blood work) drawn today and your tests are completely normal, you will receive your results only by: MyChart Message (if you have MyChart) OR A paper copy in the mail If you have any lab test that is abnormal or  we need to change your treatment, we will call you to review the results.  Testing/Procedures: 05/06/2024 Your physician has requested that you have an echocardiogram. Echocardiography is a painless test that uses sound waves to create images of your heart. It provides your doctor with information about the size and shape of your heart and how well your hearts chambers and valves are working. This procedure takes approximately one hour. There are no restrictions for this  procedure. Please do NOT wear cologne, perfume, aftershave, or lotions (deodorant is allowed). Please arrive 15 minutes prior to your appointment time.  Please note: We ask at that you not bring children with you during ultrasound (echo/ vascular) testing. Due to room size and safety concerns, children are not allowed in the ultrasound rooms during exams. Our front office staff cannot provide observation of children in our lobby area while testing is being conducted. An adult accompanying a patient to their appointment will only be allowed in the ultrasound room at the discretion of the ultrasound technician under special circumstances. We apologize for any inconvenience.   Follow-Up: At [facility], you and your health needs are our priority.  As part of our continuing mission to provide you with exceptional heart care, our providers are all part of one team.  This team includes your primary Cardiologist (physician) and Advanced Practice Providers or APPs (Physician Assistants and Nurse Practitioners) who all work together to provide you with the care you need, when you need it.  Your next appointment:   As scheduled on 05/06/2024  Provider:   Izetta Hummer, PA-C  We recommend signing up for the patient portal called MyChart.  Sign up information is provided on this After Visit Summary.  MyChart is used to connect with patients for Virtual Visits (Telemedicine).  Patients are able to view lab/test results, encounter  notes, upcoming appointments, etc.  Non-urgent messages can be sent to your provider as well.   To learn more about what you can do with MyChart, go to [email or web address].     Other Instructions We have printed a copy of your CT report that we would like you to show your regular doctor. Please see the highlighted portion to the CT attached.      Signed, Larry Hummer, PA-C  04/19/2024 12:57 PM    [facility]     [1]  Current Meds  Medication Sig   amoxicillin  (AMOXIL ) 500 MG tablet Take 4 tablets (2,000 mg total) by mouth as directed. 1 hour prior to dental work including cleanings   aspirin  EC 81 MG tablet Take 81 mg by mouth daily.   atenolol  (TENORMIN ) 25 MG tablet Take 1 tablet (25 mg total) by mouth daily.   DULoxetine  (CYMBALTA ) 30 MG capsule Take 30 mg by mouth daily.   fluticasone (FLONASE) 50 MCG/ACT nasal spray Place 2 sprays into the nose daily as needed for allergies.   glipiZIDE (GLUCOTROL) 10 MG tablet Take 10 mg by mouth daily before breakfast.   latanoprost  (XALATAN ) 0.005 % ophthalmic solution Place 1 drop into both eyes at bedtime.   losartan (COZAAR) 100 MG tablet Take 100 mg by mouth daily.   naloxone (NARCAN) nasal spray 4 mg/0.1 mL Place into the nose.   rosuvastatin  (CRESTOR ) 40 MG tablet Take 40 mg by mouth daily.   tamsulosin  (FLOMAX ) 0.4 MG CAPS capsule Take 0.4 mg by mouth at bedtime.   zolpidem  (AMBIEN ) 10 MG tablet Take 10 mg by mouth at bedtime as needed for sleep.   "

## 2024-04-19 ENCOUNTER — Ambulatory Visit: Admitting: Physician Assistant

## 2024-04-19 VITALS — BP 110/58 | HR 72 | Ht 72.0 in | Wt 260.0 lb

## 2024-04-19 DIAGNOSIS — I10 Essential (primary) hypertension: Secondary | ICD-10-CM | POA: Insufficient documentation

## 2024-04-19 DIAGNOSIS — Z952 Presence of prosthetic heart valve: Secondary | ICD-10-CM | POA: Diagnosis present

## 2024-04-19 DIAGNOSIS — N1832 Chronic kidney disease, stage 3b: Secondary | ICD-10-CM | POA: Insufficient documentation

## 2024-04-19 DIAGNOSIS — K869 Disease of pancreas, unspecified: Secondary | ICD-10-CM | POA: Insufficient documentation

## 2024-04-19 DIAGNOSIS — R918 Other nonspecific abnormal finding of lung field: Secondary | ICD-10-CM | POA: Insufficient documentation

## 2024-04-19 DIAGNOSIS — R7989 Other specified abnormal findings of blood chemistry: Secondary | ICD-10-CM | POA: Insufficient documentation

## 2024-04-19 DIAGNOSIS — E041 Nontoxic single thyroid nodule: Secondary | ICD-10-CM | POA: Diagnosis present

## 2024-04-19 LAB — COMPREHENSIVE METABOLIC PANEL WITH GFR
ALT: 22 IU/L (ref 0–44)
AST: 27 IU/L (ref 0–40)
Albumin: 3.8 g/dL (ref 3.7–4.7)
Alkaline Phosphatase: 60 IU/L (ref 48–129)
BUN/Creatinine Ratio: 11 (ref 10–24)
BUN: 17 mg/dL (ref 8–27)
Bilirubin Total: 0.7 mg/dL (ref 0.0–1.2)
CO2: 21 mmol/L (ref 20–29)
Calcium: 9.3 mg/dL (ref 8.6–10.2)
Chloride: 103 mmol/L (ref 96–106)
Creatinine, Ser: 1.48 mg/dL — ABNORMAL HIGH (ref 0.76–1.27)
Globulin, Total: 2.7 g/dL (ref 1.5–4.5)
Glucose: 103 mg/dL — ABNORMAL HIGH (ref 70–99)
Potassium: 4.4 mmol/L (ref 3.5–5.2)
Sodium: 140 mmol/L (ref 134–144)
Total Protein: 6.5 g/dL (ref 6.0–8.5)
eGFR: 47 mL/min/1.73 — ABNORMAL LOW

## 2024-04-19 MED ORDER — AMOXICILLIN 500 MG PO TABS: 2000.0000 mg | ORAL_TABLET | ORAL | 12 refills | Status: AC

## 2024-04-19 NOTE — Patient Instructions (Signed)
 Medication Instructions:  Your physician has recommended you make the following change in your medication:  START Amoxicillin  500 mg, take 4 tablets by mouth 1 hour prior to dental procedures and cleanings.    *If you need a refill on your cardiac medications before your next appointment, please call your pharmacy*  Lab Work: TODAY, 04/1924 CMP If you have labs (blood work) drawn today and your tests are completely normal, you will receive your results only by: MyChart Message (if you have MyChart) OR A paper copy in the mail If you have any lab test that is abnormal or we need to change your treatment, we will call you to review the results.  Testing/Procedures: 05/06/2024 Your physician has requested that you have an echocardiogram. Echocardiography is a painless test that uses sound waves to create images of your heart. It provides your doctor with information about the size and shape of your heart and how well your hearts chambers and valves are working. This procedure takes approximately one hour. There are no restrictions for this procedure. Please do NOT wear cologne, perfume, aftershave, or lotions (deodorant is allowed). Please arrive 15 minutes prior to your appointment time.  Please note: We ask at that you not bring children with you during ultrasound (echo/ vascular) testing. Due to room size and safety concerns, children are not allowed in the ultrasound rooms during exams. Our front office staff cannot provide observation of children in our lobby area while testing is being conducted. An adult accompanying a patient to their appointment will only be allowed in the ultrasound room at the discretion of the ultrasound technician under special circumstances. We apologize for any inconvenience.   Follow-Up: At [facility], you and your health needs are our priority.  As part of our continuing mission to provide you with exceptional heart care, our providers are all part of one  team.  This team includes your primary Cardiologist (physician) and Advanced Practice Providers or APPs (Physician Assistants and Nurse Practitioners) who all work together to provide you with the care you need, when you need it.  Your next appointment:   As scheduled on 05/06/2024  Provider:   Izetta Hummer, PA-C  We recommend signing up for the patient portal called MyChart.  Sign up information is provided on this After Visit Summary.  MyChart is used to connect with patients for Virtual Visits (Telemedicine).  Patients are able to view lab/test results, encounter notes, upcoming appointments, etc.  Non-urgent messages can be sent to your provider as well.   To learn more about what you can do with MyChart, go to [email or web address].     Other Instructions We have printed a copy of your CT report that we would like you to show your regular doctor. Please see the highlighted portion to the CT attached.

## 2024-04-20 ENCOUNTER — Ambulatory Visit: Payer: Self-pay | Admitting: Physician Assistant

## 2024-04-21 ENCOUNTER — Telehealth (HOSPITAL_COMMUNITY): Payer: Self-pay

## 2024-04-21 NOTE — Telephone Encounter (Signed)
 Patient's wife called this morning returning our call regarding cardiac rehab. Informed her I needed to look through patient's chart to see if we have a DPR on file and it wasn't in his immediate documents, she was upset and insisted I could talk to her. I informed her that unless her husband was there and could give verbal permission I would need to look for written consent. While I was still looking through his media for it she hung up the phone. Upon further review I confirmed there is no DPR on file to speak with her.  Called patient this afternoon, his wife picked up and said she'd pass the message on to him to call cardiac rehab.

## 2024-05-05 NOTE — Progress Notes (Unsigned)
 " [facility]   MULTIDISCIPLINARY HEART VALVE CLINIC                                     Cardiology Office Note:    Date:  05/07/2024   ID:  Larry Liu, DOB 1940-09-19, MRN [id number]  PCP:  Clinic, Bonni Lien  [facility] Cardiologist:  Lurena MARLA Red, MD  [facility] Structural heart: [name], MD [facility] Electrophysiologist:  None   Referring MD: Clinic, Bonni Lien   [facility] s/p TAVR  History of Present Illness:    Larry Liu is a 84 y.o. 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   Admitted in 12/2023 for a syncopal episode. Also complained of fatigue and dyspnea. Echo 02/03/24 showed EF 55%, mild MS, and severe LFLG AS: mean grad 20 mmHg, Vmax 3.08 m/s, AVA 1.24 cm2, DVI 0.52, SVI 31. L/RHC 02/03/24 showed no significant CAD and moderate to severe aortic stenosis. S/p TAVR with a 26 mm Edwards Sapien 3 Ultra Resilia THV via the TF approach on 04/13/24. Post operative echo showed EF >75%, normally functioning TAVR with a mean gradient of 10 mmHg and no PVL.  Today the patient presents to clinic for follow up. Here alone. Not having any issues since TAVR. Able to move around a bit better. No CP or SOB. No LE edema, orthopnea or PND. No dizziness or syncope. No blood in stool or urine. No palpitations. Missed echo today. Had been having nause and vomitting today. No other associated symptoms.   Past Medical History:  Diagnosis Date   Diabetes mellitus without complication (HCC)    Difficult intubation    Per records, pt wears medical bracelet stating difficult intubation   High cholesterol    Hypertension    Obesity    Sleep apnea      Current Medications: Active Medications[1]    ROS:   Please see the history of present illness.    All other systems reviewed and are negative.  EKGs       Risk Assessment/Calculations:            Physical Exam:    VS:  BP 108/64   Pulse 98   Ht 6' (1.829 m)   Wt 263 lb (119.3 kg)   SpO2 (!) 76%   BMI 35.67 kg/m     Wt Readings from Last 3 Encounters:  05/06/24 263 lb (119.3 kg)  04/19/24 260 lb (117.9 kg)  04/14/24 268 lb 1.3 oz (121.6 kg)     GEN: Well nourished, well developed in no acute distress NECK: No JVD CARDIAC: RRR, no murmurs, rubs, gallops RESPIRATORY:  Clear to auscultation without rales, wheezing or rhonchi  ABDOMEN: Soft, non-tender, non-distended EXTREMITIES:  No edema; No deformity.    ASSESSMENT:    1. S/P TAVR (transcatheter aortic valve replacement)   2. Primary hypertension   3. CKD stage 3b, GFR 30-44 ml/min (HCC)   4. Lesion of pancreas   5. Thyroid nodule   6. Pulmonary nodules      PLAN:    In order of problems listed above:  Severe AS s/p TAVR:  -- Pt missed echo today. This has been r/s to 05/17/24. -- NYHA class I symptoms.  -- SBE prophylaxis discussed; he has  amoxicillin .   -- Continue Aspirin  81mg  daily. -- I will see back for 1 year echo and OV.  HTN: -- BP well controlled.  -- Continue losartan 100mg  daily and atenolol  25mg  daily.    CKD stage IIIb: -- Creat baseline 1.4-1.98 -- Creat 1.4 04/14/24.    Lesion of pancreas: -- Noted on pre TAVR CT. 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  -- Discussed with pt and no further work up needed.    Thyroid nodules: -- Noted on TAVR CTs.  -- Consideration should be given toward further evaluation by dedicated thyroid ultrasound for definitive characterization.  -- This was printed out for the pt to follow up with his PCP.   Pulmonary nodules: -- Noted on TAVR CTs. -- If patient is low risk for malignancy, no routine follow-up imaging is recommended. If patient is high risk for malignancy, a  non-contrast chest CT at 12 months is optional.   -- Has a long smoking history so we will get a CT in 1 year.       Cardiac Rehabilitation Eligibility Assessment  The patient is ready to start cardiac rehabilitation from a cardiac standpoint.      Medication Adjustments/Labs and Tests Ordered: Current medicines are reviewed at length with the patient today.  Concerns regarding medicines are outlined above.  Orders Placed This Encounter  Procedures   ECHOCARDIOGRAM COMPLETE   Meds ordered this encounter  Medications   ondansetron  (ZOFRAN -ODT) 4 MG disintegrating tablet    Sig: Dissolve 1 tablet (4 mg total) by mouth every 8 (eight) hours as needed for nausea or vomiting.    Dispense:  20 tablet    Refill:  0    Supervising Provider:   WONDA SHARPER [[id number]]    Patient Instructions  Medication Instructions:  Your physician has recommended you make the following change in your medication:  START taking 1 tablet of Zofran  (ondansetron ) 4mg  by mouth every 8 hours as needed for nausea.  *If you need a refill on your cardiac medications before your next appointment, please call your pharmacy*  Lab Work: None needed If you have labs (blood work) drawn today and your tests are completely normal, you will receive your results only by: MyChart Message (if you have MyChart) OR A paper copy in the mail If you have any lab test that is abnormal or we need to change your treatment, we will call you to review the results.  Testing/Procedures: 05/17/2024 Your physician has requested that you have an echocardiogram. Echocardiography is a painless test that uses sound waves to create images of your heart. It provides your doctor with information about the size and shape of your heart and how well your hearts chambers and valves are working. This procedure takes approximately one hour. There are no restrictions for this procedure. Please do NOT wear cologne, perfume, aftershave, or lotions  (deodorant is allowed). Please arrive 15 minutes prior to your appointment time.  Please note: We ask at that you not bring children with you during ultrasound (echo/ vascular) testing. Due to room size and safety concerns, children are not allowed in the ultrasound rooms during exams. Our front office staff cannot provide observation of children in our lobby area while testing is being conducted. An adult accompanying a patient to their appointment will only be allowed in the ultrasound room at the  discretion of the ultrasound technician under special circumstances. We apologize for any inconvenience.   Follow-Up: At [facility], you and your health needs are our priority.  As part of our continuing mission to provide you with exceptional heart care, our providers are all part of one team.  This team includes your primary Cardiologist (physician) and Advanced Practice Providers or APPs (Physician Assistants and Nurse Practitioners) who all work together to provide you with the care you need, when you need it.  Your next appointment:   As scheduled on 04/14/2025  Provider:   Izetta Hummer, PA-C  We recommend signing up for the patient portal called MyChart.  Sign up information is provided on this After Visit Summary.  MyChart is used to connect with patients for Virtual Visits (Telemedicine).  Patients are able to view lab/test results, encounter notes, upcoming appointments, etc.  Non-urgent messages can be sent to your provider as well.   To learn more about what you can do with MyChart, go to [email or web address].          Signed, Lamarr Hummer, PA-C  05/07/2024 8:09 AM    [facility]     [1]  Current Meds  Medication Sig   ondansetron  (ZOFRAN -ODT) 4 MG disintegrating tablet Dissolve 1 tablet (4 mg total) by mouth every 8 (eight) hours as needed for nausea or vomiting.   "

## 2024-05-06 ENCOUNTER — Ambulatory Visit

## 2024-05-06 ENCOUNTER — Other Ambulatory Visit (HOSPITAL_COMMUNITY): Payer: Self-pay

## 2024-05-06 ENCOUNTER — Ambulatory Visit: Admitting: Physician Assistant

## 2024-05-06 VITALS — BP 108/64 | HR 98 | Ht 72.0 in | Wt 263.0 lb

## 2024-05-06 DIAGNOSIS — Z952 Presence of prosthetic heart valve: Secondary | ICD-10-CM

## 2024-05-06 DIAGNOSIS — R918 Other nonspecific abnormal finding of lung field: Secondary | ICD-10-CM

## 2024-05-06 DIAGNOSIS — K869 Disease of pancreas, unspecified: Secondary | ICD-10-CM

## 2024-05-06 DIAGNOSIS — N1832 Chronic kidney disease, stage 3b: Secondary | ICD-10-CM

## 2024-05-06 DIAGNOSIS — I10 Essential (primary) hypertension: Secondary | ICD-10-CM

## 2024-05-06 DIAGNOSIS — E041 Nontoxic single thyroid nodule: Secondary | ICD-10-CM

## 2024-05-06 MED ORDER — ONDANSETRON 4 MG PO TBDP
4.0000 mg | ORAL_TABLET | Freq: Three times a day (TID) | ORAL | 0 refills | Status: AC | PRN
  Filled 2024-05-06: qty 20, 7d supply, fill #0

## 2024-05-06 NOTE — Patient Instructions (Signed)
 Medication Instructions:  Your physician has recommended you make the following change in your medication:  START taking 1 tablet of Zofran  (ondansetron ) 4mg  by mouth every 8 hours as needed for nausea.  *If you need a refill on your cardiac medications before your next appointment, please call your pharmacy*  Lab Work: None needed If you have labs (blood work) drawn today and your tests are completely normal, you will receive your results only by: MyChart Message (if you have MyChart) OR A paper copy in the mail If you have any lab test that is abnormal or we need to change your treatment, we will call you to review the results.  Testing/Procedures: 05/17/2024 Your physician has requested that you have an echocardiogram. Echocardiography is a painless test that uses sound waves to create images of your heart. It provides your doctor with information about the size and shape of your heart and how well your hearts chambers and valves are working. This procedure takes approximately one hour. There are no restrictions for this procedure. Please do NOT wear cologne, perfume, aftershave, or lotions (deodorant is allowed). Please arrive 15 minutes prior to your appointment time.  Please note: We ask at that you not bring children with you during ultrasound (echo/ vascular) testing. Due to room size and safety concerns, children are not allowed in the ultrasound rooms during exams. Our front office staff cannot provide observation of children in our lobby area while testing is being conducted. An adult accompanying a patient to their appointment will only be allowed in the ultrasound room at the discretion of the ultrasound technician under special circumstances. We apologize for any inconvenience.   Follow-Up: At [facility], you and your health needs are our priority.  As part of our continuing mission to provide you with exceptional heart care, our providers are all part of one team.  This  team includes your primary Cardiologist (physician) and Advanced Practice Providers or APPs (Physician Assistants and Nurse Practitioners) who all work together to provide you with the care you need, when you need it.  Your next appointment:   As scheduled on 04/14/2025  Provider:   Izetta Hummer, PA-C  We recommend signing up for the patient portal called MyChart.  Sign up information is provided on this After Visit Summary.  MyChart is used to connect with patients for Virtual Visits (Telemedicine).  Patients are able to view lab/test results, encounter notes, upcoming appointments, etc.  Non-urgent messages can be sent to your provider as well.   To learn more about what you can do with MyChart, go to [email or web address].

## 2024-05-17 ENCOUNTER — Ambulatory Visit (HOSPITAL_COMMUNITY)

## 2025-04-14 ENCOUNTER — Ambulatory Visit: Admitting: Physician Assistant

## 2025-04-14 ENCOUNTER — Ambulatory Visit (HOSPITAL_COMMUNITY)
